# Patient Record
Sex: Female | Born: 1965 | Hispanic: No | Marital: Married | Smoking: Never smoker
Health system: Southern US, Community
[De-identification: ages and names within clinical notes are randomized; demographics above are authoritative.]

## PROBLEM LIST (undated history)

## (undated) HISTORY — PX: OTHER SURGICAL HISTORY: SHX169

---

## 1997-07-01 ENCOUNTER — Inpatient Hospital Stay (HOSPITAL_COMMUNITY): Admission: AD | Admit: 1997-07-01 | Discharge: 1997-07-04 | Payer: Self-pay | Admitting: Obstetrics and Gynecology

## 1998-04-12 ENCOUNTER — Other Ambulatory Visit: Admission: RE | Admit: 1998-04-12 | Discharge: 1998-04-12 | Payer: Self-pay | Admitting: Obstetrics & Gynecology

## 1998-04-20 ENCOUNTER — Ambulatory Visit (HOSPITAL_COMMUNITY): Admission: RE | Admit: 1998-04-20 | Discharge: 1998-04-20 | Payer: Self-pay | Admitting: Obstetrics & Gynecology

## 1998-08-13 ENCOUNTER — Inpatient Hospital Stay (HOSPITAL_COMMUNITY): Admission: AD | Admit: 1998-08-13 | Discharge: 1998-08-16 | Payer: Self-pay | Admitting: Obstetrics & Gynecology

## 2001-03-12 ENCOUNTER — Other Ambulatory Visit: Admission: RE | Admit: 2001-03-12 | Discharge: 2001-03-12 | Payer: Self-pay | Admitting: Obstetrics and Gynecology

## 2002-04-14 ENCOUNTER — Other Ambulatory Visit: Admission: RE | Admit: 2002-04-14 | Discharge: 2002-04-14 | Payer: Self-pay | Admitting: Obstetrics and Gynecology

## 2003-10-08 ENCOUNTER — Other Ambulatory Visit: Admission: RE | Admit: 2003-10-08 | Discharge: 2003-10-08 | Payer: Self-pay | Admitting: Obstetrics and Gynecology

## 2004-02-03 ENCOUNTER — Ambulatory Visit (HOSPITAL_COMMUNITY): Admission: RE | Admit: 2004-02-03 | Discharge: 2004-02-03 | Payer: Self-pay | Admitting: Family Medicine

## 2004-02-19 ENCOUNTER — Ambulatory Visit (HOSPITAL_COMMUNITY): Admission: RE | Admit: 2004-02-19 | Discharge: 2004-02-19 | Payer: Self-pay | Admitting: *Deleted

## 2005-11-09 ENCOUNTER — Ambulatory Visit (HOSPITAL_COMMUNITY): Admission: RE | Admit: 2005-11-09 | Discharge: 2005-11-09 | Payer: Self-pay | Admitting: Family Medicine

## 2006-02-25 ENCOUNTER — Emergency Department (HOSPITAL_COMMUNITY): Admission: EM | Admit: 2006-02-25 | Discharge: 2006-02-25 | Payer: Self-pay | Admitting: *Deleted

## 2006-03-27 ENCOUNTER — Ambulatory Visit (HOSPITAL_COMMUNITY): Admission: RE | Admit: 2006-03-27 | Discharge: 2006-03-27 | Payer: Self-pay | Admitting: Gastroenterology

## 2007-12-18 ENCOUNTER — Ambulatory Visit (HOSPITAL_COMMUNITY): Admission: RE | Admit: 2007-12-18 | Discharge: 2007-12-18 | Payer: Self-pay | Admitting: Family Medicine

## 2008-01-24 HISTORY — PX: BREAST BIOPSY: SHX20

## 2009-03-03 IMAGING — US US ABDOMEN COMPLETE
1 series · 14 of 25 positions shown · non-contrast
Comparison: None.

CLINICAL DATA: 41 year-old abdominal pain.
 ABDOMEN ULTRASOUND:
TECHNIQUE: Complete abdominal ultrasound examination was performed including evaluation of the liver, gallbladder, bile ducts, pancreas, kidneys, spleen, IVC, and abdominal aorta.

[Series 1: unknown · 0.28mm/px · 14 of 55 slices shown]
[im 1/55]
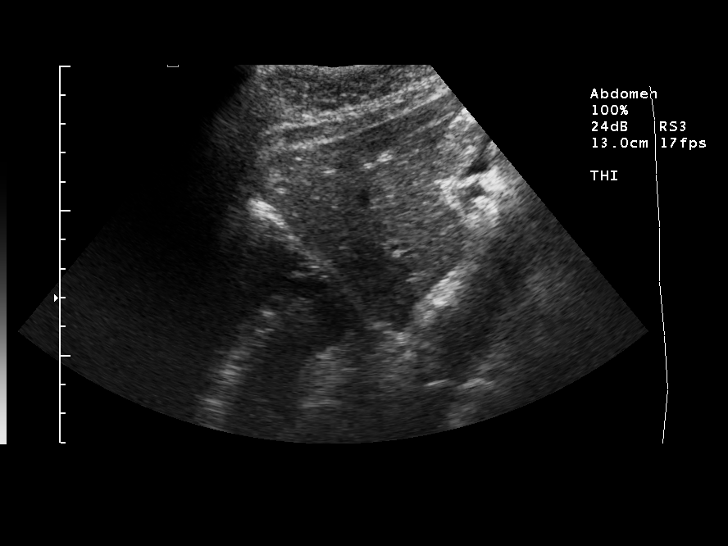
[im 5/55]
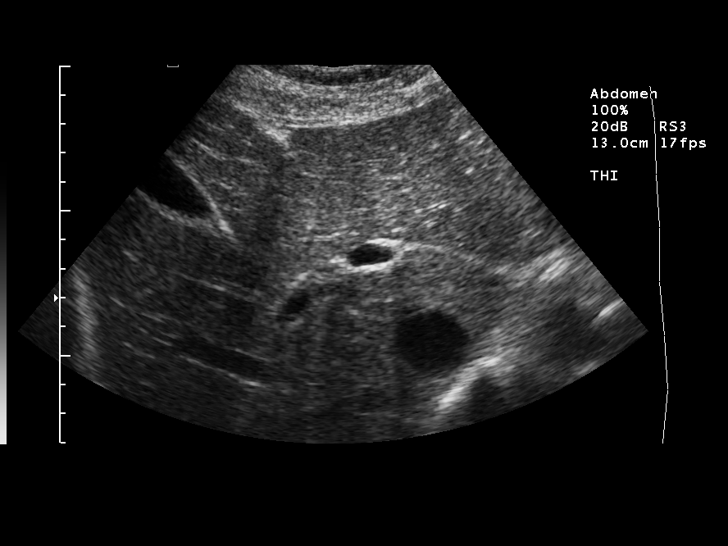
[im 10/55]
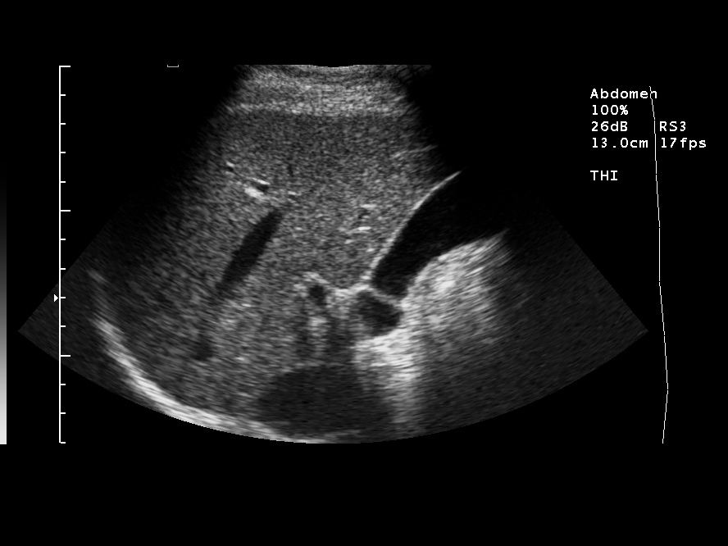
[im 14/55]
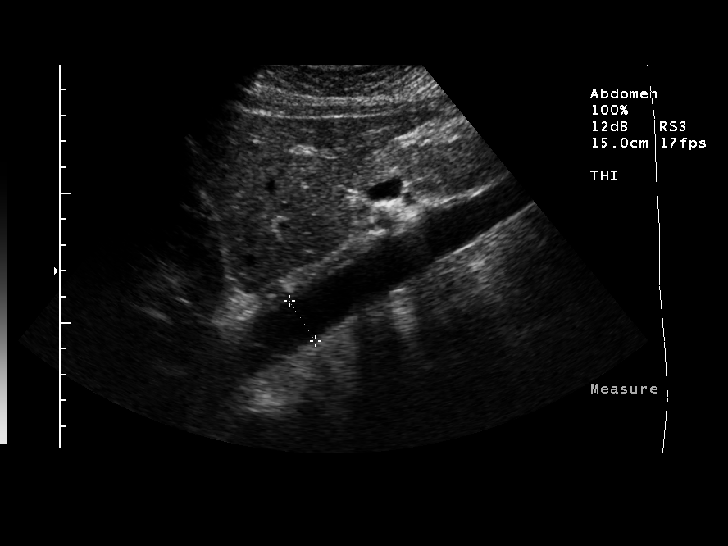
[im 19/55]
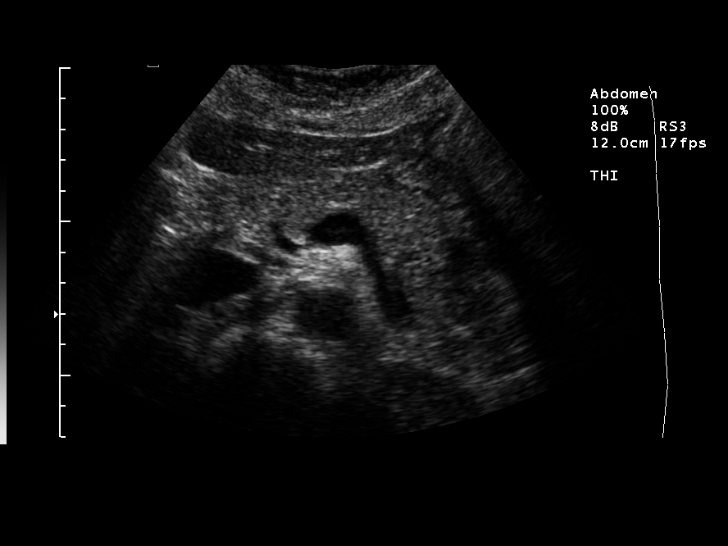
[im 21/55]
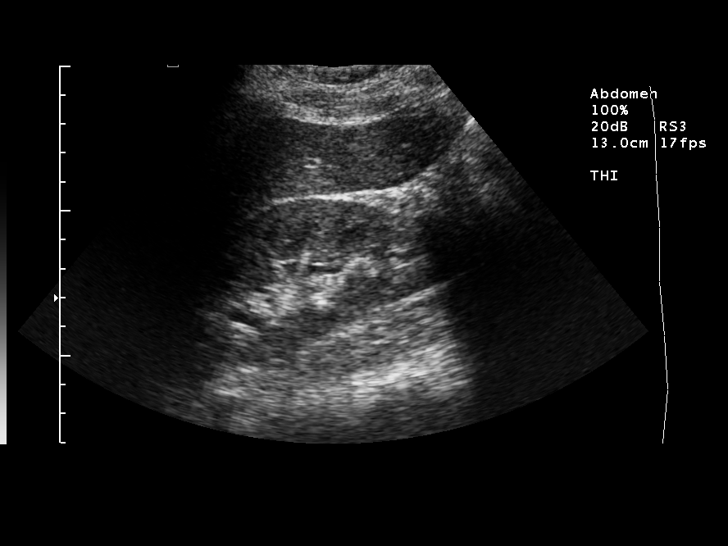
[im 25/55]
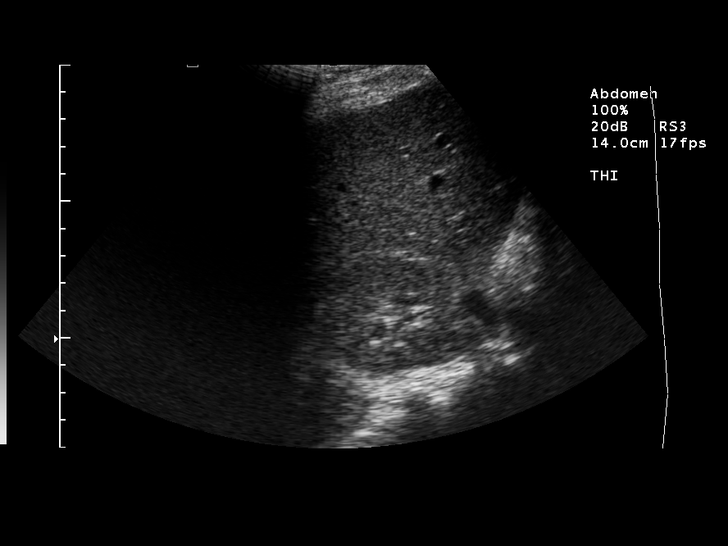
[im 30/55]
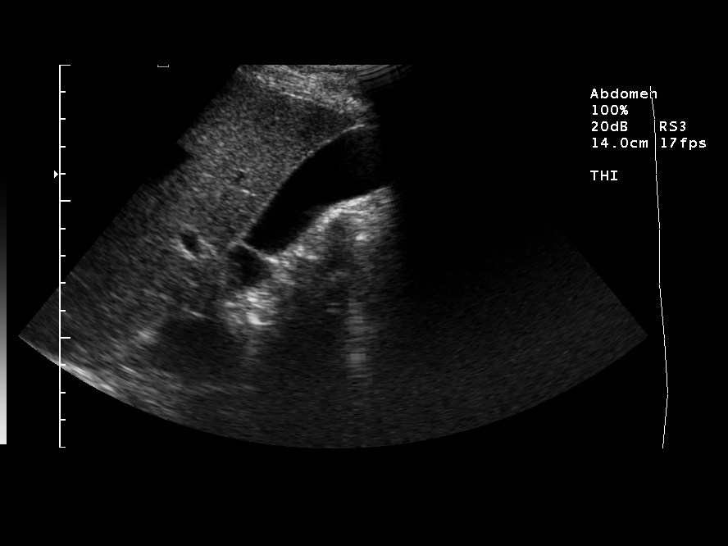
[im 34/55]
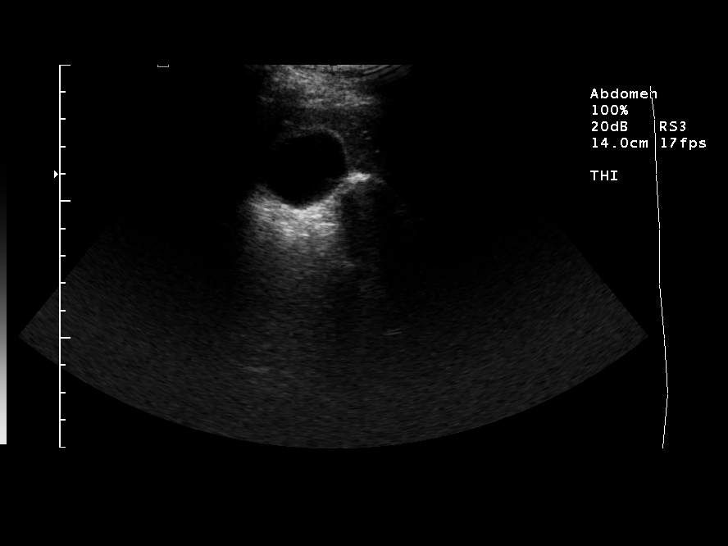
[im 37/55]
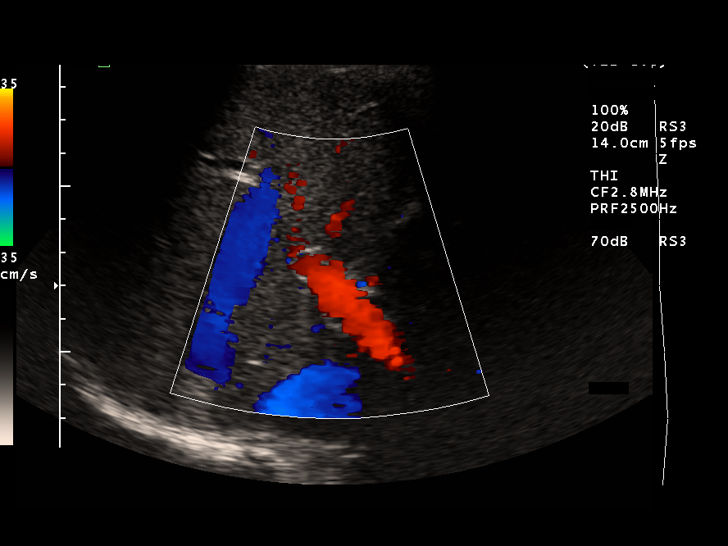
[im 41/55]
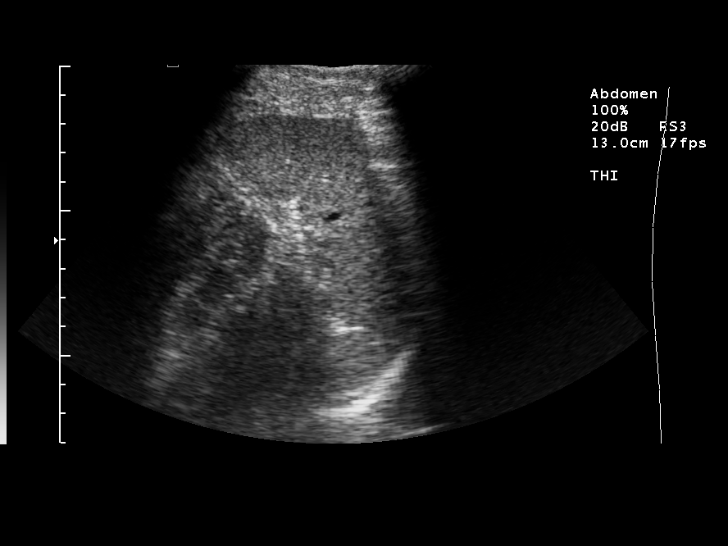
[im 46/55]
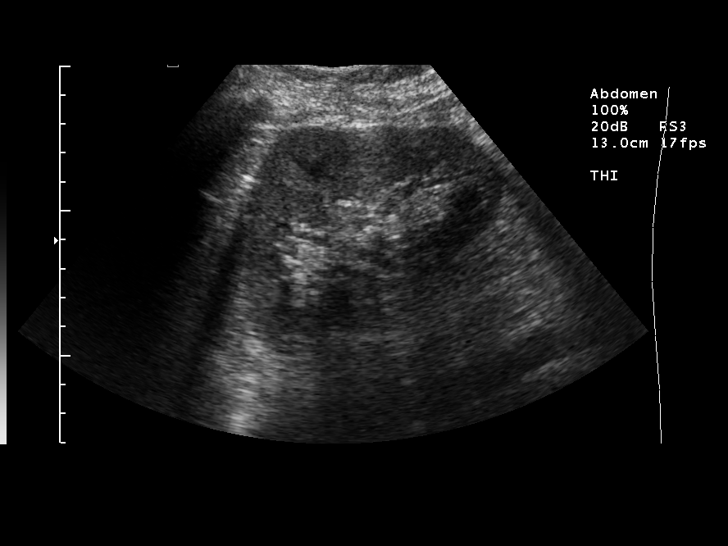
[im 50/55]
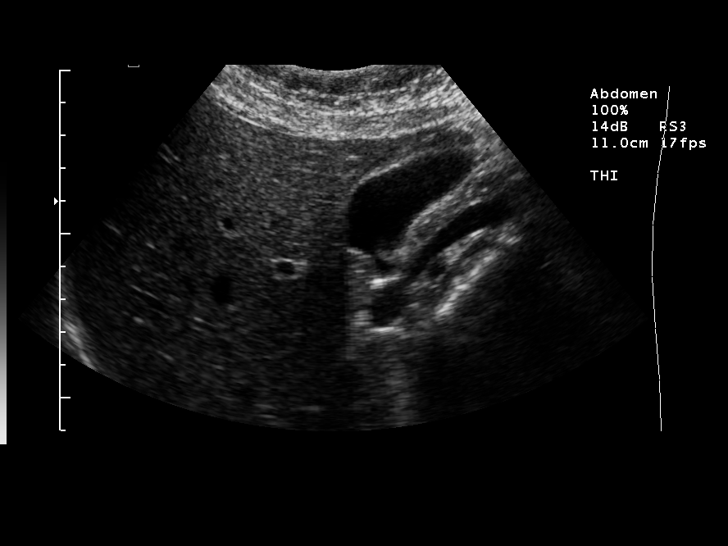
[im 55/55]
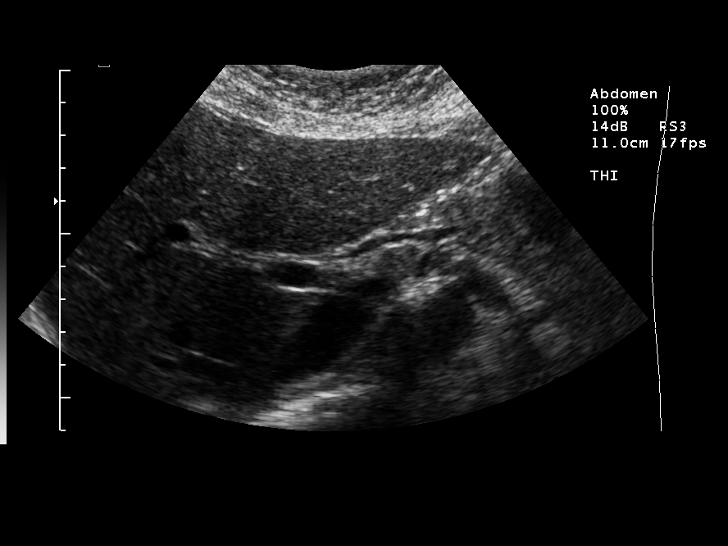

[14 of 25 positions shown; findings below may reference images not displayed]

FINDINGS: The liver is sonographically normal. No focal hepatic lesions or intrahepatic biliary dilatation. Common bile duct is normal in caliber measuring 4.0 mm. Gallbladder is sonographically normal. 
 The pancreas is well visualized and demonstrates no sonographic abnormalities.  The IVC and aorta are normal in caliber.
 The spleen is normal in size and demonstrates normal echogenicity.  No focal lesions.
 The right kidney measures 11.4 cm. The left kidney measures 11.1 cm.  Normal renal cortical thickness. No hydronephrosis or renal masses.  No perinephric fluid collections.
IMPRESSION: Unremarkable abdominal ultrasound examination.

## 2010-02-12 ENCOUNTER — Encounter: Payer: Self-pay | Admitting: Family Medicine

## 2010-07-25 ENCOUNTER — Other Ambulatory Visit: Payer: Self-pay | Admitting: Family Medicine

## 2010-07-25 DIAGNOSIS — Z139 Encounter for screening, unspecified: Secondary | ICD-10-CM

## 2010-07-29 ENCOUNTER — Ambulatory Visit (HOSPITAL_COMMUNITY)
Admission: RE | Admit: 2010-07-29 | Discharge: 2010-07-29 | Disposition: A | Discharge status: Home / Self Care | Payer: BC Managed Care – PPO | Source: Ambulatory Visit | Attending: Family Medicine | Admitting: Family Medicine

## 2010-07-29 DIAGNOSIS — Z1231 Encounter for screening mammogram for malignant neoplasm of breast: Secondary | ICD-10-CM | POA: Insufficient documentation

## 2010-07-29 DIAGNOSIS — Z139 Encounter for screening, unspecified: Secondary | ICD-10-CM

## 2011-12-12 ENCOUNTER — Other Ambulatory Visit (HOSPITAL_COMMUNITY): Payer: Self-pay | Admitting: Nurse Practitioner

## 2011-12-12 DIAGNOSIS — Z139 Encounter for screening, unspecified: Secondary | ICD-10-CM

## 2011-12-25 ENCOUNTER — Ambulatory Visit (HOSPITAL_COMMUNITY)
Admission: RE | Admit: 2011-12-25 | Discharge: 2011-12-25 | Disposition: A | Discharge status: Home / Self Care | Payer: Self-pay | Source: Ambulatory Visit | Attending: Nurse Practitioner | Admitting: Nurse Practitioner

## 2011-12-25 DIAGNOSIS — Z139 Encounter for screening, unspecified: Secondary | ICD-10-CM

## 2012-12-13 ENCOUNTER — Encounter (INDEPENDENT_AMBULATORY_CARE_PROVIDER_SITE_OTHER): Payer: Self-pay

## 2012-12-13 ENCOUNTER — Ambulatory Visit (INDEPENDENT_AMBULATORY_CARE_PROVIDER_SITE_OTHER): Payer: BC Managed Care – PPO | Admitting: General Practice

## 2012-12-13 ENCOUNTER — Encounter: Payer: Self-pay | Admitting: General Practice

## 2012-12-13 VITALS — BP 125/80 | HR 87 | Temp 98.3°F | Ht 69.25 in | Wt 186.0 lb

## 2012-12-13 DIAGNOSIS — Z01419 Encounter for gynecological examination (general) (routine) without abnormal findings: Secondary | ICD-10-CM

## 2012-12-13 DIAGNOSIS — Z124 Encounter for screening for malignant neoplasm of cervix: Secondary | ICD-10-CM

## 2012-12-13 DIAGNOSIS — Z0000 Encounter for general adult medical examination without abnormal findings: Secondary | ICD-10-CM

## 2012-12-13 LAB — POCT CBC
Granulocyte percent: 67.6 %G (ref 37–80)
HCT, POC: 40.1 % (ref 37.7–47.9)
Hemoglobin: 13.4 g/dL (ref 12.2–16.2)
Lymph, poc: 1.8 (ref 0.6–3.4)
MCHC: 33.5 g/dL (ref 31.8–35.4)
MPV: 6.8 fL (ref 0–99.8)
POC Granulocyte: 4.5 (ref 2–6.9)
RBC: 4.5 M/uL (ref 4.04–5.48)

## 2012-12-13 NOTE — Progress Notes (Signed)
  Subjective:    Patient ID: Mary Park, female    DOB: [birth year], 47 y.o.   MRN: [id number]  HPI Patient presents today for annual exam. She reports healthy eating and regular exercise up until 2 years ago. She reports having a stressful job and changed positions 2 years ago. Reports last menstrual cycle was Oct. 28, 2014 and regular.     Review of Systems  Constitutional: Negative for fever and chills.  Respiratory: Negative for chest tightness and shortness of breath.   Cardiovascular: Negative for chest pain and palpitations.  Gastrointestinal: Negative for nausea, vomiting, abdominal pain, diarrhea, constipation and blood in stool.  Genitourinary: Negative for dysuria and difficulty urinating.  Musculoskeletal: Negative for back pain.  Neurological: Negative for dizziness, weakness and headaches.       Objective:   Physical Exam  Constitutional: She is oriented to person, place, and time. She appears well-developed and well-nourished.  HENT:  Head: Normocephalic and atraumatic.  Right Ear: External ear normal.  Left Ear: External ear normal.  Mouth/Throat: Oropharynx is clear and moist.  Eyes: Conjunctivae and EOM are normal. Pupils are equal, round, and reactive to light.  Neck: Normal range of motion. Neck supple. No thyromegaly present.  Cardiovascular: Normal rate, regular rhythm, normal heart sounds and intact distal pulses.   Pulmonary/Chest: Effort normal and breath sounds normal. No respiratory distress. She exhibits no tenderness. Right breast exhibits no inverted nipple, no mass, no nipple discharge, no skin change and no tenderness. Left breast exhibits no inverted nipple, no mass, no nipple discharge, no skin change and no tenderness. Breasts are symmetrical.  Abdominal: Soft. Bowel sounds are normal. She exhibits no distension. There is no tenderness.  Genitourinary: Vagina normal and uterus normal. No breast swelling, tenderness, discharge or bleeding. No labial  fusion. There is no rash, tenderness, lesion or injury on the right labia. There is no rash, tenderness, lesion or injury on the left labia. Uterus is not deviated, not enlarged, not fixed and not tender. Cervix exhibits no motion tenderness, no discharge and no friability. Right adnexum displays no mass, no tenderness and no fullness. Left adnexum displays no mass, no tenderness and no fullness. No erythema, tenderness or bleeding around the vagina. No foreign body around the vagina. No signs of injury around the vagina. No vaginal discharge found.  Musculoskeletal: Normal range of motion.  Lymphadenopathy:    She has no cervical adenopathy.  Neurological: She is alert and oriented to person, place, and time.  Skin: Skin is warm and dry.  Psychiatric: She has a normal mood and affect.          Assessment & Plan:  1. Annual physical exam - POCT CBC - CMP14+EGFR - NMR, lipoprofile - Pap IG w/ reflex to HPV when ASC-U -discussed healthy eating and regular exercise -Patient verbalized understanding -Coralie Keens, FNP-C

## 2012-12-13 NOTE — Patient Instructions (Signed)

## 2012-12-15 LAB — CMP14+EGFR
ALT: 9 IU/L (ref 0–32)
Albumin/Globulin Ratio: 1.6 (ref 1.1–2.5)
Albumin: 4.6 g/dL (ref 3.5–5.5)
BUN: 9 mg/dL (ref 6–24)
Calcium: 10.2 mg/dL (ref 8.7–10.2)
Creatinine, Ser: 0.57 mg/dL (ref 0.57–1.00)
GFR calc Af Amer: 129 mL/min/{1.73_m2} (ref >59)
GFR calc non Af Amer: 112 mL/min/{1.73_m2} (ref >59)
Globulin, Total: 2.9 g/dL (ref 1.5–4.5)
Glucose: 88 mg/dL (ref 65–99)
Potassium: 4.3 mmol/L (ref 3.5–5.2)
Total Bilirubin: 0.5 mg/dL (ref 0.0–1.2)
Total Protein: 7.5 g/dL (ref 6.0–8.5)

## 2012-12-15 LAB — NMR, LIPOPROFILE
Cholesterol: 169 mg/dL (ref <200)
HDL Cholesterol by NMR: 69 mg/dL (ref >=40)
LDL Size: 21.2 nm (ref >20.5)
Small LDL Particle Number: 122 nmol/L (ref <=527)
Triglycerides by NMR: 84 mg/dL (ref <150)

## 2012-12-17 ENCOUNTER — Other Ambulatory Visit: Payer: Self-pay | Admitting: General Practice

## 2012-12-17 DIAGNOSIS — E785 Hyperlipidemia, unspecified: Secondary | ICD-10-CM

## 2012-12-17 MED ORDER — ATORVASTATIN CALCIUM 10 MG PO TABS: 10.0000 mg | ORAL_TABLET | Freq: Every day | ORAL | Status: DC

## 2012-12-19 LAB — PAP IG W/ RFLX HPV ASCU: PAP Smear Comment: 0

## 2012-12-26 ENCOUNTER — Other Ambulatory Visit: Payer: Self-pay | Admitting: General Practice

## 2012-12-26 ENCOUNTER — Telehealth: Payer: Self-pay | Admitting: General Practice

## 2012-12-26 DIAGNOSIS — Z139 Encounter for screening, unspecified: Secondary | ICD-10-CM

## 2012-12-26 NOTE — Telephone Encounter (Signed)
Patient aware of results.

## 2012-12-27 ENCOUNTER — Ambulatory Visit (HOSPITAL_COMMUNITY)
Admission: RE | Admit: 2012-12-27 | Discharge: 2012-12-27 | Disposition: A | Discharge status: Home / Self Care | Payer: BC Managed Care – PPO | Source: Ambulatory Visit | Attending: General Practice | Admitting: General Practice

## 2012-12-27 DIAGNOSIS — Z1231 Encounter for screening mammogram for malignant neoplasm of breast: Secondary | ICD-10-CM | POA: Insufficient documentation

## 2012-12-27 DIAGNOSIS — Z139 Encounter for screening, unspecified: Secondary | ICD-10-CM

## 2014-01-13 ENCOUNTER — Other Ambulatory Visit: Payer: Self-pay | Admitting: Family Medicine

## 2014-01-13 DIAGNOSIS — Z139 Encounter for screening, unspecified: Secondary | ICD-10-CM

## 2014-01-21 ENCOUNTER — Ambulatory Visit (HOSPITAL_COMMUNITY)
Admission: RE | Admit: 2014-01-21 | Discharge: 2014-01-21 | Disposition: A | Discharge status: Home / Self Care | Payer: BC Managed Care – PPO | Source: Ambulatory Visit | Attending: Family Medicine | Admitting: Family Medicine

## 2014-01-21 DIAGNOSIS — Z1231 Encounter for screening mammogram for malignant neoplasm of breast: Secondary | ICD-10-CM | POA: Insufficient documentation

## 2014-01-21 DIAGNOSIS — Z139 Encounter for screening, unspecified: Secondary | ICD-10-CM

## 2014-01-21 DIAGNOSIS — R928 Other abnormal and inconclusive findings on diagnostic imaging of breast: Secondary | ICD-10-CM | POA: Insufficient documentation

## 2014-01-22 ENCOUNTER — Other Ambulatory Visit: Payer: Self-pay | Admitting: Family Medicine

## 2014-01-22 DIAGNOSIS — IMO0002 Reserved for concepts with insufficient information to code with codable children: Secondary | ICD-10-CM

## 2014-01-22 DIAGNOSIS — R229 Localized swelling, mass and lump, unspecified: Secondary | ICD-10-CM

## 2014-01-22 DIAGNOSIS — N6320 Unspecified lump in the left breast, unspecified quadrant: Secondary | ICD-10-CM

## 2014-02-03 ENCOUNTER — Other Ambulatory Visit: Payer: Self-pay | Admitting: Family Medicine

## 2014-02-03 ENCOUNTER — Ambulatory Visit (HOSPITAL_COMMUNITY)
Admission: RE | Admit: 2014-02-03 | Discharge: 2014-02-03 | Disposition: A | Discharge status: Home / Self Care | Payer: 59 | Source: Ambulatory Visit | Attending: Family Medicine | Admitting: Family Medicine

## 2014-02-03 DIAGNOSIS — R229 Localized swelling, mass and lump, unspecified: Principal | ICD-10-CM

## 2014-02-03 DIAGNOSIS — IMO0002 Reserved for concepts with insufficient information to code with codable children: Secondary | ICD-10-CM

## 2014-02-03 DIAGNOSIS — N63 Unspecified lump in breast: Secondary | ICD-10-CM | POA: Insufficient documentation

## 2014-02-03 DIAGNOSIS — N6320 Unspecified lump in the left breast, unspecified quadrant: Secondary | ICD-10-CM

## 2014-02-03 MED ORDER — LIDOCAINE HCL (PF) 2 % IJ SOLN
INTRAMUSCULAR | Status: AC
  Filled 2014-02-03: qty 10

## 2014-02-03 NOTE — Discharge Instructions (Signed)
Fine Needle Aspiration °Fine needle aspiration is a procedure used to remove a piece of tissue. The tissue may be removed from a swelling or abnormal growth (tumor). It is also used to confirm a cyst. A cyst is a fluid filled sac. The procedure may be done by your caregiver or a specialist. °LET YOUR CAREGIVER KNOW ABOUT:  °· Any medications you take, especially blood thinners like aspirin. °· Any problems you may have had with similar procedures in the past. °RISKS AND COMPLICATIONS °This is a safe procedure. There is a very small risk of infection and or bleeding. Other complications can occur if the aspiration site is deep in the body. Your caregiver or specialist will explain this to you.  °BEFORE THE PROCEDURE  °· No special preparation is needed in most cases. Your caregiver will let you know if there are special requirements, such as an empty stomach. °· Be sure to ask your caregiver any questions before the procedure starts. °PROCEDURE  °· This procedure is done under local or no anesthetic. °· The skin is cleaned carefully. °· A thin needle is directed into a lump. The needle is directed in several different directions into the lump while suction is applied to the needle. When samples are removed, the needle is withdrawn. °· The contents obtained are placed on a slide. They are then fixed, stained and examined under the microscope. The slide is examined by a specialist in the examination of tissue (pathologist). °· A diagnosis can then be made. The pathologist will decide if the specimen is cancerous (malignant) or not cancerous (benign). If fluid is taken from a cyst, cells from the fluid can be examined. If no material is obtained from a fine needle aspiration, the sample may not rule out a problem. Sometimes the procedure is done again. The pathologist may need several days before a result is available. °AFTER THE PROCEDURE  °· There are usually no limits on diet or activity after a fine needle  aspiration. °· Your caregiver may give you instructions regarding the aspiration site. This will include information about keeping the site clean and dry. Follow these instructions carefully. °· Call for your test results as instructed by your caregiver. Remember, it is your responsibility to get the results of your testing. Do not think everything is fine if you have not heard from your caregiver. °· Keep a close watch on the aspiration site. Report any redness, swelling or drainage. °· You should not have much pain at the aspiration site. °· Take any medications as told by your caregiver. °SEEK MEDICAL CARE IF:  °· You have pain or drainage at the aspiration site that does not go away. °· Swelling at the aspiration site does not gradually go away. °SEEK IMMEDIATE MEDICAL CARE IF:  °· You develop a fever a day or two after the aspiration. °· You develop severe pain at the aspiration site. °· You develop a warm, tender swelling at the aspiration site. °Document Released: 01/07/2000 Document Revised: 04/03/2011 Document Reviewed: 09/20/2007 °ExitCare® Patient Information ©2015 [employer]. This information is not intended to replace advice given to you by your health care provider. Make sure you discuss any questions you have with your health care provider. ° °

## 2014-06-02 ENCOUNTER — Ambulatory Visit (INDEPENDENT_AMBULATORY_CARE_PROVIDER_SITE_OTHER): Payer: 59 | Admitting: Family Medicine

## 2014-06-02 ENCOUNTER — Encounter: Payer: Self-pay | Admitting: Family Medicine

## 2014-06-02 VITALS — BP 135/75 | HR 81 | Temp 98.1°F | Ht 69.25 in | Wt 194.4 lb

## 2014-06-02 DIAGNOSIS — N644 Mastodynia: Secondary | ICD-10-CM

## 2014-06-02 NOTE — Patient Instructions (Signed)
Fibrocystic Breast Changes Fibrocystic breast changes occur when breast ducts become blocked, causing painful, fluid-filled lumps (cysts) to form in the breast. This is a common condition that is noncancerous (benign). It occurs when women go through hormonal changes during their menstrual cycle. Fibrocystic breast changes can affect one or both breasts. CAUSES  The exact cause of fibrocystic breast changes is not known, but it may be related to the female hormones estrogen and progesterone. Family traits that get passed from parent to child (genetics) may also be a factor in some cases. SIGNS AND SYMPTOMS   Tenderness, mild discomfort, or pain.   Swelling.   Ropelike feeling when touching the breast.   Lumpy breast, one or both sides.   Changes in breast size, especially before (larger) and after (smaller) the menstrual period.   Green or dark brown nipple discharge (not blood).  Symptoms are usually worse before menstrual periods start and get better toward the end of the menstrual period.  DIAGNOSIS  To make a diagnosis, your health care provider will ask you questions and perform a physical exam of your breasts. The health care provider may recommend other tests that can examine inside your breasts, such as:  A breast X-ray (mammogram).   Ultrasonography.  An MRI.  If something more than fibrocystic breast changes is suspected, your health care provider may take a breast tissue sample (breast biopsy) to examine. TREATMENT  Often, treatment is not needed. Your health care provider may recommend over-the-counter pain relievers to help lessen pain or discomfort caused by the fibrocystic breast changes. You may also be asked to change your diet to limit or stop eating foods or drinking beverages that contain caffeine. Foods and beverages that contain caffeine include chocolate, soda, coffee, and tea. Reducing sugar and fat in your diet may also help. Your health care provider may  also recommend:  Fine needle aspiration to remove fluid from a cyst that is causing pain.   Surgery to remove a large, persistent, and tender cyst. HOME CARE INSTRUCTIONS   Examine your breasts after every menstrual period. If you do not have menstrual periods, check your breasts the first day of every month. Feel for changes, such as more tenderness, a new growth, a change in breast size, or a change in a lump that has always been there.   Only take over-the-counter or prescription medicine as directed by your health care provider.   Wear a well-fitted support or sports bra, especially when exercising.   Decrease or avoid caffeine, fat, and sugar in your diet as directed by your health care provider.  SEEK MEDICAL CARE IF:   You have fluid leaking (discharge) from your nipples, especially bloody discharge.   You have new lumps or bumps in the breast.   Your breast or breasts become enlarged, red, and painful.   You have areas of your breast that pucker in.   Your nipples appear flat or indented.  Document Released: 10/26/2005 Document Revised: 01/14/2013 Document Reviewed: 06/30/2012 ExitCare Patient Information 2015 [employer]. This information is not intended to replace advice given to you by your health care provider. Make sure you discuss any questions you have with your health care provider.  

## 2014-06-02 NOTE — Progress Notes (Signed)
   Subjective:    Patient ID: Mary Park, female    DOB: [birth year], 49 y.o.   MRN: [id number]  HPI 49 year old female with breast pain. She has a history of fibrocystic disease dating back 10 years. She had a cyst removed about 10 years ago. Prior to Christmas of 2015 she began having more pain on the left side. She had a mammogram with subsequent ultrasound and biopsy. She was told that there was only cystic changes in follow-up in one year. Since then she's continued to have breast pain and it seems like it moves around. There is a positive family history of her older sister  There are no active problems to display for this patient.  Outpatient Encounter Prescriptions as of 06/02/2014  Medication Sig  . atorvastatin (LIPITOR) 10 MG tablet Take 1 tablet (10 mg total) by mouth daily. (Patient not taking: Reported on 06/02/2014)   No facility-administered encounter medications on file as of 06/02/2014.      Review of Systems  Constitutional: Negative.   HENT: Negative.   Eyes: Negative.   Respiratory: Negative.   Cardiovascular: Negative.   Gastrointestinal: Negative.   Endocrine: Negative.   Genitourinary: Negative.   Hematological: Negative.   Psychiatric/Behavioral: Negative.        Objective:   Physical Exam  Pulmonary/Chest:  Exam confined to left breast. There are no palpable cysts or masses in the breast and it does not seem to be tender on exam area there is no discharge from the nipple.          Assessment & Plan:  1. Breast pain, left Probable fibrocystic disease schedule - US BREAST COMPLETE UNI LEFT INC AXILLA; Future  Frederica KusterStephen M Miller MD

## 2014-06-08 ENCOUNTER — Other Ambulatory Visit: Payer: Self-pay

## 2014-06-08 DIAGNOSIS — N644 Mastodynia: Secondary | ICD-10-CM

## 2014-06-12 ENCOUNTER — Telehealth: Payer: Self-pay

## 2014-06-12 NOTE — Telephone Encounter (Signed)
[facility] said they do not work from a workqueue and they see the order for the Dx mammogram to have the patient call and set up   I called and LM for patient to call them to schedule

## 2014-06-18 ENCOUNTER — Ambulatory Visit
Admission: RE | Admit: 2014-06-18 | Discharge: 2014-06-18 | Disposition: A | Discharge status: Home / Self Care | Payer: 59 | Source: Ambulatory Visit | Attending: Family Medicine | Admitting: Family Medicine

## 2014-06-18 ENCOUNTER — Other Ambulatory Visit: Payer: Self-pay | Admitting: Family Medicine

## 2014-06-18 DIAGNOSIS — N644 Mastodynia: Secondary | ICD-10-CM

## 2014-07-24 ENCOUNTER — Encounter: Payer: Self-pay | Admitting: Nurse Practitioner

## 2014-08-10 ENCOUNTER — Ambulatory Visit (INDEPENDENT_AMBULATORY_CARE_PROVIDER_SITE_OTHER): Payer: 59

## 2014-08-10 ENCOUNTER — Encounter (INDEPENDENT_AMBULATORY_CARE_PROVIDER_SITE_OTHER): Payer: Self-pay

## 2014-08-10 ENCOUNTER — Encounter: Payer: Self-pay | Admitting: Nurse Practitioner

## 2014-08-10 ENCOUNTER — Ambulatory Visit (INDEPENDENT_AMBULATORY_CARE_PROVIDER_SITE_OTHER): Payer: 59 | Admitting: Nurse Practitioner

## 2014-08-10 VITALS — BP 132/79 | HR 83 | Temp 97.0°F | Ht 69.0 in | Wt 190.0 lb

## 2014-08-10 DIAGNOSIS — Z0000 Encounter for general adult medical examination without abnormal findings: Secondary | ICD-10-CM | POA: Diagnosis not present

## 2014-08-10 DIAGNOSIS — Z01419 Encounter for gynecological examination (general) (routine) without abnormal findings: Secondary | ICD-10-CM

## 2014-08-10 DIAGNOSIS — N3000 Acute cystitis without hematuria: Secondary | ICD-10-CM

## 2014-08-10 LAB — POCT URINALYSIS DIPSTICK
Bilirubin, UA: NEGATIVE
Glucose, UA: NEGATIVE
Ketones, UA: NEGATIVE
NITRITE UA: NEGATIVE
Spec Grav, UA: 1.01
Urobilinogen, UA: NEGATIVE
pH, UA: 6.5

## 2014-08-10 LAB — POCT CBC
Granulocyte percent: 64.2 %G (ref 37–80)
HCT, POC: 38.4 % (ref 37.7–47.9)
HEMOGLOBIN: 12.7 g/dL (ref 12.2–16.2)
LYMPH, POC: 2 (ref 0.6–3.4)
MCH: 28.1 pg (ref 27–31.2)
MCHC: 33.2 g/dL (ref 31.8–35.4)
MCV: 84.7 fL (ref 80–97)
MPV: 6.8 fL (ref 0–99.8)
POC Granulocyte: 4.4 (ref 2–6.9)
POC LYMPH %: 29.6 % (ref 10–50)
Platelet Count, POC: 306 10*3/uL (ref 142–424)
RBC: 4.53 M/uL (ref 4.04–5.48)
RDW, POC: 14.2 %
WBC: 6.8 10*3/uL (ref 4.6–10.2)

## 2014-08-10 LAB — POCT UA - MICROSCOPIC ONLY
CRYSTALS, UR, HPF, POC: NEGATIVE
Casts, Ur, LPF, POC: NEGATIVE
Yeast, UA: NEGATIVE

## 2014-08-10 MED ORDER — NITROFURANTOIN MONOHYD MACRO 100 MG PO CAPS: 100.0000 mg | ORAL_CAPSULE | Freq: Two times a day (BID) | ORAL | Status: DC

## 2014-08-10 NOTE — Progress Notes (Signed)
Subjective:    Patient ID: Mary Park, female    DOB: [birth year], 49 y.o.   MRN: [id number]  HPI Patient here today for CPE with pap- she has no current medical problems and is on no meds. No complaint today  Review of Systems  Constitutional: Negative.   HENT: Negative.   Respiratory: Negative.   Cardiovascular: Negative.   Genitourinary: Negative.   Neurological: Negative.   Psychiatric/Behavioral: Negative.   All other systems reviewed and are negative.      Objective:   Physical Exam  Constitutional: She is oriented to person, place, and time. She appears well-developed and well-nourished.  HENT:  Head: Normocephalic.  Right Ear: Hearing, tympanic membrane, external ear and ear canal normal.  Left Ear: Hearing, tympanic membrane, external ear and ear canal normal.  Nose: Nose normal.  Mouth/Throat: Uvula is midline and oropharynx is clear and moist.  Eyes: Conjunctivae and EOM are normal. Pupils are equal, round, and reactive to light.  Neck: Trachea normal, normal range of motion and full passive range of motion without pain. Neck supple. No JVD present. Carotid bruit is not present. No thyroid mass and no thyromegaly present.  Cardiovascular: Normal rate, regular rhythm, normal heart sounds and intact distal pulses.  Exam reveals no gallop and no friction rub.   No murmur heard. Pulmonary/Chest: Effort normal and breath sounds normal. Right breast exhibits no inverted nipple, no mass, no nipple discharge, no skin change and no tenderness. Left breast exhibits no inverted nipple, no mass, no nipple discharge, no skin change and no tenderness.  Abdominal: Soft. Bowel sounds are normal. She exhibits no distension and no mass. There is no tenderness.  Genitourinary: Vagina normal and uterus normal. No breast swelling, tenderness, discharge or bleeding.  bimanual exam-No adnexal masses or tenderness. Cervix parous and pink- no discharge  Musculoskeletal: Normal range of  motion.  Lymphadenopathy:    She has no cervical adenopathy.  Neurological: She is alert and oriented to person, place, and time. She has normal reflexes.  Skin: Skin is warm and dry.  Psychiatric: She has a normal mood and affect. Her behavior is normal. Judgment and thought content normal.   BP 132/79 mmHg  Pulse 83  Temp(Src) 97 F (36.1 C) (Oral)  Ht 5' 9" (1.753 m)  Wt 190 lb (86.183 kg)  BMI 28.05 kg/m2  EKG- NSR-Mary-Margaret Hassell Done, FNP  Chest x ray- normal-Preliminary reading by Ronnald Collum, FNP  [facility]       Assessment & Plan:  1. Annual physical exam - POCT UA - Microscopic Only - POCT urinalysis dipstick - POCT CBC - CMP14+EGFR - Lipid panel - Thyroid Panel With TSH - Vit D  25 hydroxy (rtn osteoporosis monitoring) - DG Chest 2 View; Future - EKG 12-Lead  2. Encounter for routine gynecological examination - Pap IG w/ reflex to HPV when ASC-U  3. Acute cystitis without hematuria Meds ordered this encounter  Medications  . nitrofurantoin, macrocrystal-monohydrate, (MACROBID) 100 MG capsule    Sig: Take 1 capsule (100 mg total) by mouth 2 (two) times daily. 1 po BId    Dispense:  14 capsule    Refill:  0    Order Specific Question:  Supervising Provider    Answer:  Chipper Herb [[id number]]   Take medication as prescribe Cotton underwear Take shower not bath Cranberry juice, yogurt Force fluids AZO over the counter X2 days Culture pending RTO prn   Labs pending Health maintenance reviewed Diet and exercise encouraged Continue  all meds Follow up  In 6 months   [place], FNP

## 2014-08-10 NOTE — Patient Instructions (Signed)

## 2014-08-11 LAB — PAP IG W/ RFLX HPV ASCU: PAP Smear Comment: 0

## 2014-08-11 LAB — CMP14+EGFR
ALBUMIN: 4.5 g/dL (ref 3.5–5.5)
ALK PHOS: 75 IU/L (ref 39–117)
ALT: 12 IU/L (ref 0–32)
AST: 13 IU/L (ref 0–40)
Albumin/Globulin Ratio: 1.3 (ref 1.1–2.5)
BUN/Creatinine Ratio: 15 (ref 9–23)
BUN: 9 mg/dL (ref 6–24)
Bilirubin Total: 0.5 mg/dL (ref 0.0–1.2)
CALCIUM: 10 mg/dL (ref 8.7–10.2)
CO2: 19 mmol/L (ref 18–29)
Chloride: 103 mmol/L (ref 97–108)
Creatinine, Ser: 0.62 mg/dL (ref 0.57–1.00)
GFR calc non Af Amer: 108 mL/min/{1.73_m2} (ref >59)
GFR, EST AFRICAN AMERICAN: 124 mL/min/{1.73_m2} (ref >59)
Globulin, Total: 3.5 g/dL (ref 1.5–4.5)
Glucose: 87 mg/dL (ref 65–99)
POTASSIUM: 4.1 mmol/L (ref 3.5–5.2)
Sodium: 137 mmol/L (ref 134–144)
Total Protein: 8 g/dL (ref 6.0–8.5)

## 2014-08-11 LAB — THYROID PANEL WITH TSH
Free Thyroxine Index: 2.4 (ref 1.2–4.9)
T3 UPTAKE RATIO: 26 % (ref 24–39)
T4, Total: 9.4 ug/dL (ref 4.5–12.0)
TSH: 1.37 u[IU]/mL (ref 0.450–4.500)

## 2014-08-11 LAB — LIPID PANEL
CHOL/HDL RATIO: 2.5 ratio (ref 0.0–4.4)
Cholesterol, Total: 167 mg/dL (ref 100–199)
HDL: 68 mg/dL (ref >39)
LDL Calculated: 80 mg/dL (ref 0–99)
Triglycerides: 93 mg/dL (ref 0–149)
VLDL CHOLESTEROL CAL: 19 mg/dL (ref 5–40)

## 2014-08-11 LAB — VITAMIN D 25 HYDROXY (VIT D DEFICIENCY, FRACTURES): Vit D, 25-Hydroxy: 20 ng/mL — ABNORMAL LOW (ref 30.0–100.0)

## 2014-12-25 ENCOUNTER — Telehealth: Payer: Self-pay | Admitting: Nurse Practitioner

## 2014-12-25 DIAGNOSIS — Z1239 Encounter for other screening for malignant neoplasm of breast: Secondary | ICD-10-CM

## 2014-12-25 NOTE — Telephone Encounter (Signed)
Pt would like to go [facility] this time. [facility]

## 2014-12-25 NOTE — Telephone Encounter (Signed)
Where does she usually get so I can do referral

## 2014-12-28 NOTE — Telephone Encounter (Signed)
Needs to really go to same place if possible- has she ever had abnormal mammo

## 2015-02-05 ENCOUNTER — Other Ambulatory Visit: Payer: Self-pay | Admitting: Family Medicine

## 2015-02-05 DIAGNOSIS — Z1231 Encounter for screening mammogram for malignant neoplasm of breast: Secondary | ICD-10-CM

## 2015-02-11 ENCOUNTER — Ambulatory Visit (HOSPITAL_COMMUNITY)
Admission: RE | Admit: 2015-02-11 | Discharge: 2015-02-11 | Disposition: A | Discharge status: Home / Self Care | Payer: BLUE CROSS/BLUE SHIELD | Source: Ambulatory Visit | Attending: Family Medicine | Admitting: Family Medicine

## 2015-02-11 DIAGNOSIS — Z1231 Encounter for screening mammogram for malignant neoplasm of breast: Secondary | ICD-10-CM

## 2015-02-11 DIAGNOSIS — R928 Other abnormal and inconclusive findings on diagnostic imaging of breast: Secondary | ICD-10-CM | POA: Insufficient documentation

## 2015-02-12 ENCOUNTER — Other Ambulatory Visit: Payer: Self-pay | Admitting: Family Medicine

## 2015-02-12 ENCOUNTER — Ambulatory Visit: Payer: 59 | Admitting: Nurse Practitioner

## 2015-02-12 DIAGNOSIS — R928 Other abnormal and inconclusive findings on diagnostic imaging of breast: Secondary | ICD-10-CM

## 2015-02-16 ENCOUNTER — Other Ambulatory Visit: Payer: Self-pay | Admitting: Nurse Practitioner

## 2015-02-16 DIAGNOSIS — IMO0002 Reserved for concepts with insufficient information to code with codable children: Secondary | ICD-10-CM

## 2015-02-16 DIAGNOSIS — R928 Other abnormal and inconclusive findings on diagnostic imaging of breast: Secondary | ICD-10-CM

## 2015-02-16 DIAGNOSIS — N6320 Unspecified lump in the left breast, unspecified quadrant: Secondary | ICD-10-CM

## 2015-02-16 DIAGNOSIS — R229 Localized swelling, mass and lump, unspecified: Secondary | ICD-10-CM

## 2015-02-23 ENCOUNTER — Ambulatory Visit (HOSPITAL_COMMUNITY)
Admission: RE | Admit: 2015-02-23 | Discharge: 2015-02-23 | Disposition: A | Discharge status: Home / Self Care | Payer: BLUE CROSS/BLUE SHIELD | Source: Ambulatory Visit | Attending: Nurse Practitioner | Admitting: Nurse Practitioner

## 2015-02-23 ENCOUNTER — Ambulatory Visit (HOSPITAL_COMMUNITY): Payer: BLUE CROSS/BLUE SHIELD

## 2015-02-23 ENCOUNTER — Encounter (HOSPITAL_COMMUNITY): Payer: BLUE CROSS/BLUE SHIELD

## 2015-02-23 DIAGNOSIS — R928 Other abnormal and inconclusive findings on diagnostic imaging of breast: Secondary | ICD-10-CM

## 2015-02-23 DIAGNOSIS — N63 Unspecified lump in breast: Secondary | ICD-10-CM | POA: Insufficient documentation

## 2015-02-23 DIAGNOSIS — N6320 Unspecified lump in the left breast, unspecified quadrant: Secondary | ICD-10-CM

## 2017-07-27 ENCOUNTER — Encounter: Payer: Self-pay | Admitting: Nurse Practitioner

## 2017-07-27 ENCOUNTER — Ambulatory Visit (INDEPENDENT_AMBULATORY_CARE_PROVIDER_SITE_OTHER): Payer: BLUE CROSS/BLUE SHIELD | Admitting: Nurse Practitioner

## 2017-07-27 VITALS — BP 120/75 | HR 79 | Temp 96.9°F | Ht 69.0 in | Wt 178.0 lb

## 2017-07-27 DIAGNOSIS — Z0000 Encounter for general adult medical examination without abnormal findings: Secondary | ICD-10-CM | POA: Diagnosis not present

## 2017-07-27 DIAGNOSIS — Z1231 Encounter for screening mammogram for malignant neoplasm of breast: Secondary | ICD-10-CM

## 2017-07-27 DIAGNOSIS — Z01419 Encounter for gynecological examination (general) (routine) without abnormal findings: Secondary | ICD-10-CM | POA: Diagnosis not present

## 2017-07-27 LAB — URINALYSIS, COMPLETE
Bilirubin, UA: NEGATIVE
Glucose, UA: NEGATIVE
KETONES UA: NEGATIVE
LEUKOCYTES UA: NEGATIVE
NITRITE UA: NEGATIVE
Protein, UA: NEGATIVE
SPEC GRAV UA: 1.015 (ref 1.005–1.030)
Urobilinogen, Ur: 0.2 mg/dL (ref 0.2–1.0)
pH, UA: 6 (ref 5.0–7.5)

## 2017-07-27 LAB — MICROSCOPIC EXAMINATION
Epithelial Cells (non renal): >10 /hpf — AB (ref 0–10)
Renal Epithel, UA: NONE SEEN /hpf

## 2017-07-27 NOTE — Progress Notes (Signed)
Subjective:    Patient ID: Mary Park, female    DOB: 11/22/1965, 52 y.o.   MRN: [id number]   Chief Complaint: Annual Exam   HPI Patient come sin today for annual physical exam and pap. She s doing well today without complaints. She has no current medical problems and is on no meds.    Review of Systems  Constitutional: Negative for activity change and appetite change.  HENT: Negative.   Eyes: Negative for pain.  Respiratory: Negative for shortness of breath.   Cardiovascular: Negative for chest pain, palpitations and leg swelling.  Gastrointestinal: Negative for abdominal pain.  Endocrine: Negative for polydipsia.  Genitourinary: Negative.   Skin: Negative for rash.  Neurological: Negative for dizziness, weakness and headaches.  Hematological: Does not bruise/bleed easily.  Psychiatric/Behavioral: Negative.   All other systems reviewed and are negative.      Objective:   Physical Exam  Constitutional: She is oriented to person, place, and time. She appears well-developed and well-nourished. No distress.  HENT:  Head: Normocephalic.  Nose: Nose normal.  Mouth/Throat: Oropharynx is clear and moist.  Eyes: Pupils are equal, round, and reactive to light. EOM are normal.  Neck: Normal range of motion. Neck supple. No JVD present. Carotid bruit is not present.  Cardiovascular: Normal rate, regular rhythm, normal heart sounds and intact distal pulses.  Pulmonary/Chest: Effort normal and breath sounds normal. No respiratory distress. She has no wheezes. She has no rales. She exhibits no tenderness.  Abdominal: Soft. Normal appearance, normal aorta and bowel sounds are normal. She exhibits no distension, no abdominal bruit, no pulsatile midline mass and no mass. There is no splenomegaly or hepatomegaly. There is no tenderness.  Genitourinary: Vagina normal and uterus normal. No vaginal discharge found.  Genitourinary Comments: Cervix parous and pink cystocele    Musculoskeletal: Normal range of motion. She exhibits no edema.  Lymphadenopathy:    She has no cervical adenopathy.  Neurological: She is alert and oriented to person, place, and time. She has normal reflexes.  Skin: Skin is warm and dry.  Psychiatric: She has a normal mood and affect. Her behavior is normal. Judgment and thought content normal.   BP 120/75   Pulse 79   Temp (!) 96.9 F (36.1 C) (Oral)   Ht 5' 9" (1.753 m)   Wt 178 lb (80.7 kg)   BMI 26.29 kg/m         Assessment & Plan:  Mary Park comes in today with chief complaint of Annual Exam   Diagnosis and orders addressed:  1. Annual physical exam - Urinalysis, Complete - CMP14+EGFR - Lipid panel - CBC with Differential/Platelet - Thyroid Panel With TSH  2. Gynecologic exam normal - IGP, Aptima HPV, rfx 16/18,45   Labs pending Health Maintenance reviewed Diet and exercise encouraged  Follow up plan: 1 year   Mary Martin, FNP  

## 2017-07-27 NOTE — Patient Instructions (Signed)

## 2017-07-28 LAB — CMP14+EGFR
A/G RATIO: 1.4 (ref 1.2–2.2)
ALK PHOS: 71 IU/L (ref 39–117)
ALT: 37 IU/L — AB (ref 0–32)
AST: 32 IU/L (ref 0–40)
Albumin: 4.2 g/dL (ref 3.5–5.5)
BILIRUBIN TOTAL: 0.3 mg/dL (ref 0.0–1.2)
BUN/Creatinine Ratio: 17 (ref 9–23)
BUN: 10 mg/dL (ref 6–24)
CHLORIDE: 106 mmol/L (ref 96–106)
CO2: 20 mmol/L (ref 20–29)
Calcium: 10 mg/dL (ref 8.7–10.2)
Creatinine, Ser: 0.59 mg/dL (ref 0.57–1.00)
GFR calc Af Amer: 124 mL/min/{1.73_m2} (ref >59)
GFR calc non Af Amer: 107 mL/min/{1.73_m2} (ref >59)
Globulin, Total: 2.9 g/dL (ref 1.5–4.5)
Glucose: 85 mg/dL (ref 65–99)
POTASSIUM: 4.5 mmol/L (ref 3.5–5.2)
Sodium: 137 mmol/L (ref 134–144)
Total Protein: 7.1 g/dL (ref 6.0–8.5)

## 2017-07-28 LAB — LIPID PANEL
Chol/HDL Ratio: 2.2 ratio (ref 0.0–4.4)
Cholesterol, Total: 161 mg/dL (ref 100–199)
HDL: 72 mg/dL (ref >39)
LDL Calculated: 72 mg/dL (ref 0–99)
TRIGLYCERIDES: 86 mg/dL (ref 0–149)
VLDL Cholesterol Cal: 17 mg/dL (ref 5–40)

## 2017-07-28 LAB — CBC WITH DIFFERENTIAL/PLATELET
Basophils Absolute: 0 10*3/uL (ref 0.0–0.2)
Basos: 0 %
EOS (ABSOLUTE): 0 10*3/uL (ref 0.0–0.4)
EOS: 1 %
HEMATOCRIT: 31.5 % — AB (ref 34.0–46.6)
HEMOGLOBIN: 9.8 g/dL — AB (ref 11.1–15.9)
IMMATURE GRANS (ABS): 0 10*3/uL (ref 0.0–0.1)
Immature Granulocytes: 0 %
LYMPHS ABS: 1.2 10*3/uL (ref 0.7–3.1)
LYMPHS: 23 %
MCH: 23 pg — ABNORMAL LOW (ref 26.6–33.0)
MCHC: 31.1 g/dL — AB (ref 31.5–35.7)
MCV: 74 fL — ABNORMAL LOW (ref 79–97)
MONOCYTES: 8 %
Monocytes Absolute: 0.4 10*3/uL (ref 0.1–0.9)
NEUTROS ABS: 3.5 10*3/uL (ref 1.4–7.0)
Neutrophils: 68 %
Platelets: 354 10*3/uL (ref 150–450)
RBC: 4.27 x10E6/uL (ref 3.77–5.28)
RDW: 16.9 % — ABNORMAL HIGH (ref 12.3–15.4)
WBC: 5.3 10*3/uL (ref 3.4–10.8)

## 2017-07-28 LAB — THYROID PANEL WITH TSH
FREE THYROXINE INDEX: 2 (ref 1.2–4.9)
T3 Uptake Ratio: 28 % (ref 24–39)
T4 TOTAL: 7 ug/dL (ref 4.5–12.0)
TSH: 1.61 u[IU]/mL (ref 0.450–4.500)

## 2017-07-31 LAB — IGP, APTIMA HPV, RFX 16/18,45
HPV Aptima: NEGATIVE
PAP SMEAR COMMENT: 0

## 2017-08-17 ENCOUNTER — Ambulatory Visit (HOSPITAL_COMMUNITY)
Admission: RE | Admit: 2017-08-17 | Discharge: 2017-08-17 | Disposition: A | Discharge status: Home / Self Care | Payer: BLUE CROSS/BLUE SHIELD | Source: Ambulatory Visit | Attending: Nurse Practitioner | Admitting: Nurse Practitioner

## 2017-08-17 ENCOUNTER — Encounter (HOSPITAL_COMMUNITY): Payer: Self-pay

## 2017-08-17 DIAGNOSIS — Z1231 Encounter for screening mammogram for malignant neoplasm of breast: Secondary | ICD-10-CM | POA: Diagnosis not present

## 2017-08-20 ENCOUNTER — Other Ambulatory Visit: Payer: Self-pay | Admitting: Nurse Practitioner

## 2017-08-20 DIAGNOSIS — R928 Other abnormal and inconclusive findings on diagnostic imaging of breast: Secondary | ICD-10-CM

## 2017-08-28 ENCOUNTER — Ambulatory Visit (HOSPITAL_COMMUNITY)
Admission: RE | Admit: 2017-08-28 | Discharge: 2017-08-28 | Disposition: A | Discharge status: Home / Self Care | Payer: BLUE CROSS/BLUE SHIELD | Source: Ambulatory Visit | Attending: Nurse Practitioner | Admitting: Nurse Practitioner

## 2017-08-28 DIAGNOSIS — N6314 Unspecified lump in the right breast, lower inner quadrant: Secondary | ICD-10-CM | POA: Diagnosis not present

## 2017-08-28 DIAGNOSIS — R928 Other abnormal and inconclusive findings on diagnostic imaging of breast: Secondary | ICD-10-CM | POA: Insufficient documentation

## 2017-08-28 DIAGNOSIS — R922 Inconclusive mammogram: Secondary | ICD-10-CM | POA: Diagnosis not present

## 2018-02-28 ENCOUNTER — Other Ambulatory Visit: Payer: Self-pay | Admitting: Nurse Practitioner

## 2018-02-28 DIAGNOSIS — N63 Unspecified lump in unspecified breast: Secondary | ICD-10-CM

## 2018-03-14 ENCOUNTER — Other Ambulatory Visit (HOSPITAL_COMMUNITY): Payer: BLUE CROSS/BLUE SHIELD

## 2018-03-14 ENCOUNTER — Ambulatory Visit (HOSPITAL_COMMUNITY): Admission: RE | Admit: 2018-03-14 | Payer: BLUE CROSS/BLUE SHIELD | Source: Ambulatory Visit

## 2018-03-21 DIAGNOSIS — J019 Acute sinusitis, unspecified: Secondary | ICD-10-CM | POA: Diagnosis not present

## 2018-03-21 DIAGNOSIS — H6692 Otitis media, unspecified, left ear: Secondary | ICD-10-CM | POA: Diagnosis not present

## 2018-03-21 DIAGNOSIS — R6889 Other general symptoms and signs: Secondary | ICD-10-CM | POA: Diagnosis not present

## 2018-03-21 DIAGNOSIS — R070 Pain in throat: Secondary | ICD-10-CM | POA: Diagnosis not present

## 2019-01-25 IMAGING — US US BREAST*R* LIMITED INC AXILLA
1 series · 6 of 6 positions shown · non-contrast
Comparison: Previous exam(s).

CLINICAL DATA: 52-year-old female recalled from screening mammogram
dated 08/17/2017 for possible right breast masses.

EXAM:
DIGITAL DIAGNOSTIC RIGHT MAMMOGRAM WITH CAD AND TOMO
ULTRASOUND RIGHT BREAST

[Series 1: us breast*right* limited inc axilla · 0.05mm/px · 6 of 6 slices shown]
[im 1/6]
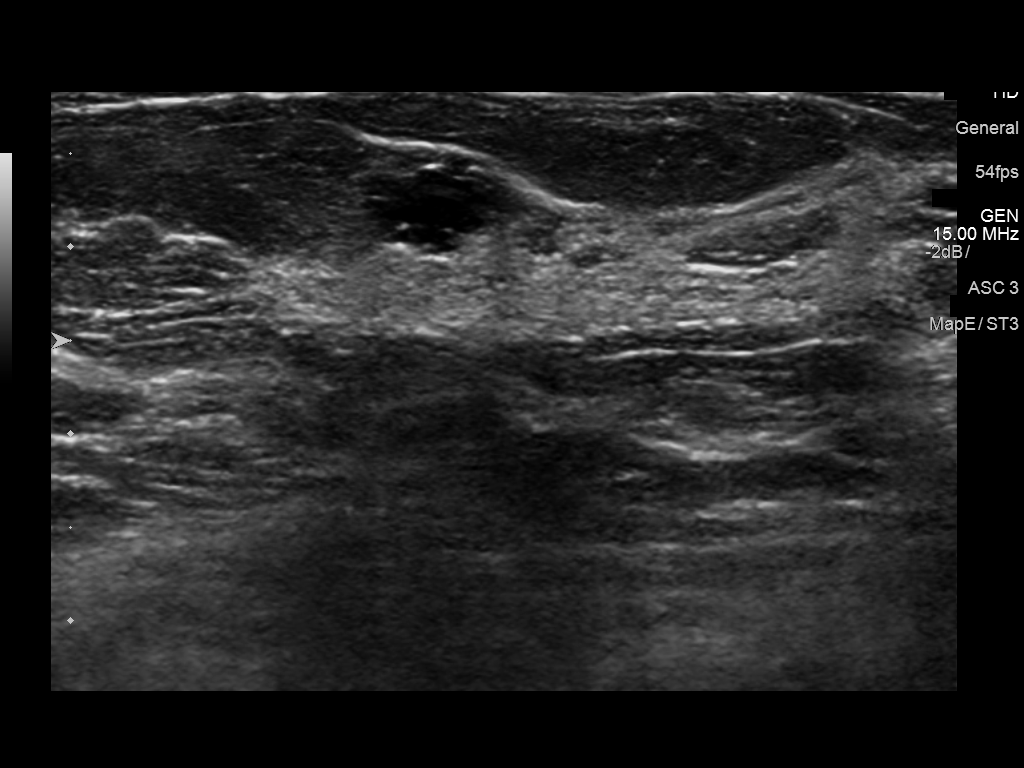
[im 2/6]
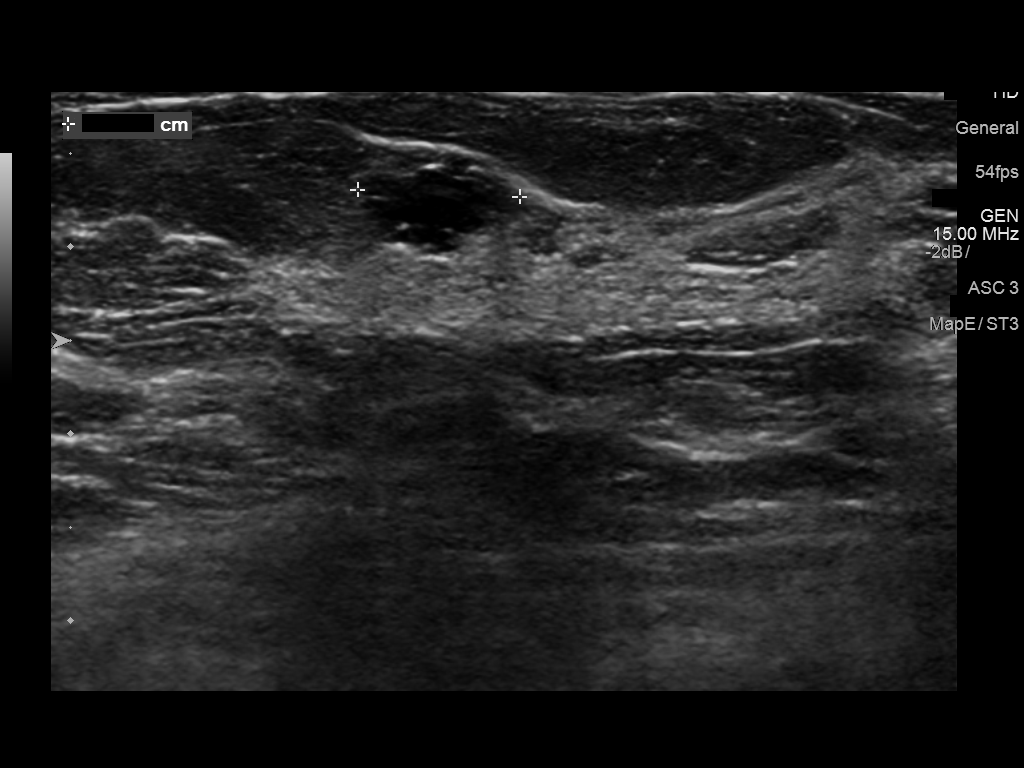
[im 3/6]
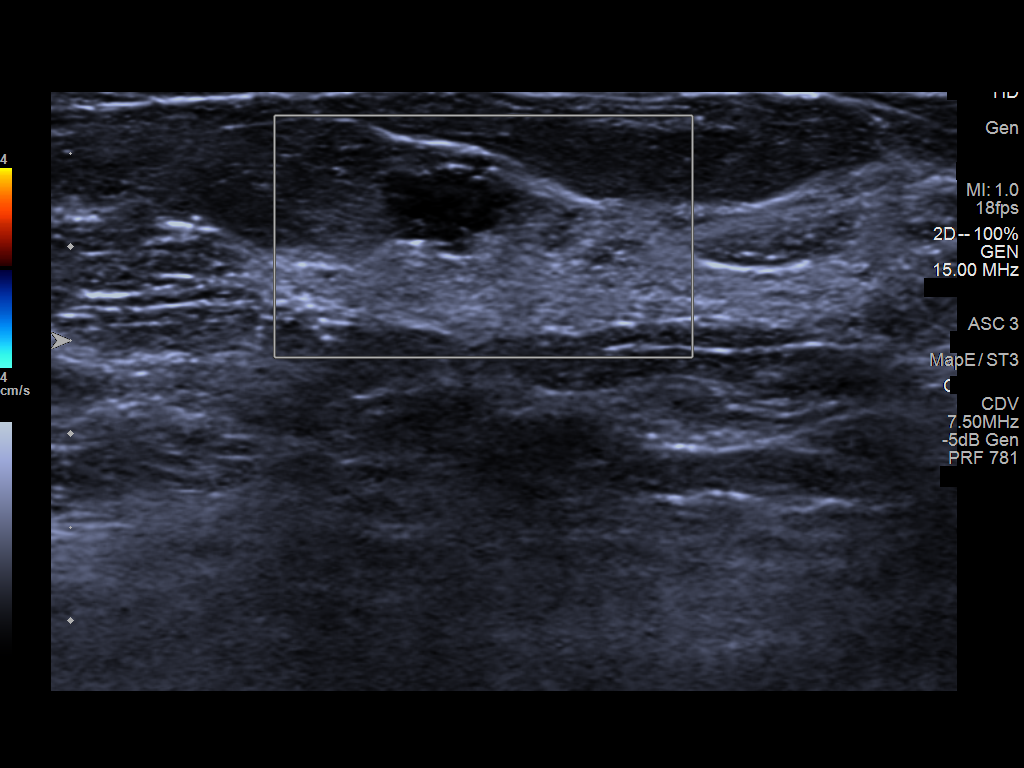
[im 4/6]
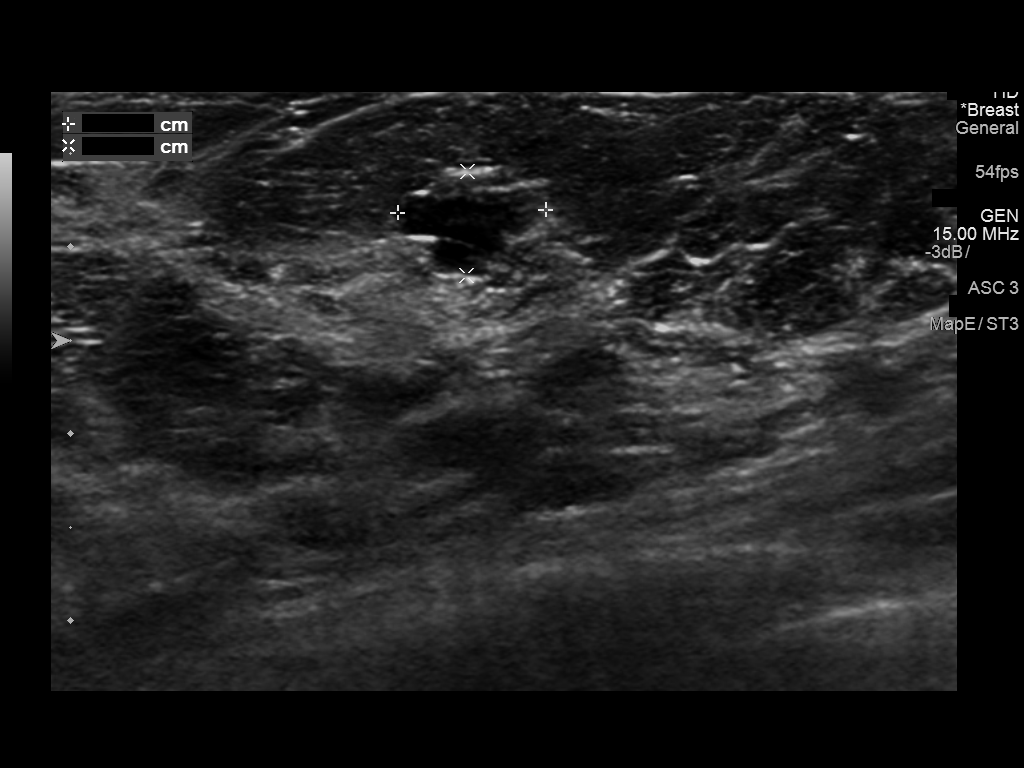
[im 5/6]
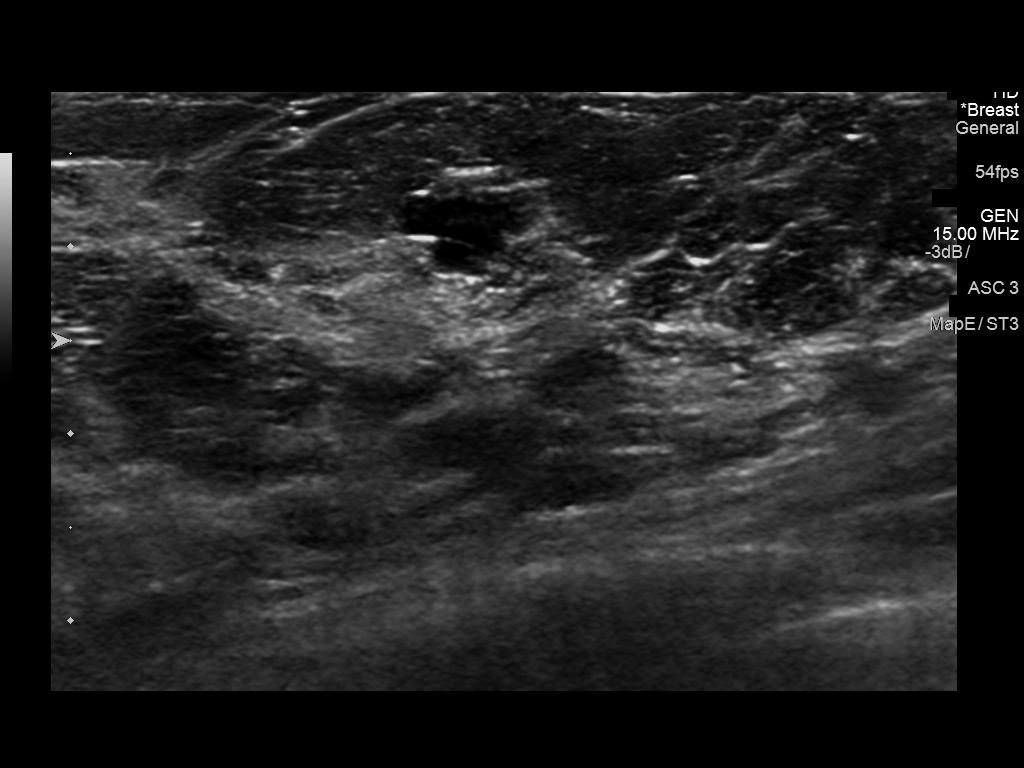
[im 6/6]
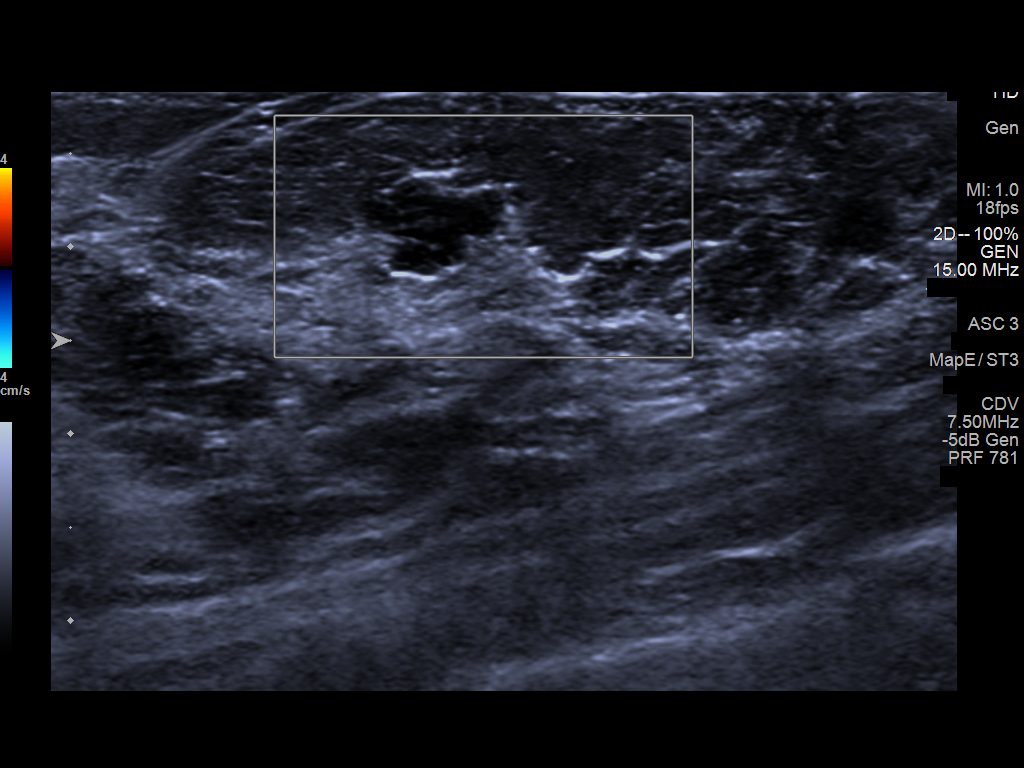

[6 of 6 positions shown; findings below may reference images not displayed]

ACR Breast Density Category c: The breast tissue is heterogeneously
dense, which may obscure small masses.
FINDINGS: The previously described, possible mass in the central posterior
right breast resolves into well dispersed fibroglandular tissue on
today's additional views. A circumscribed, lobulated mass persists
in the lower inner right breast at middle depth. Further evaluation
with ultrasound was performed.

Mammographic images were processed with CAD.

Targeted ultrasound is performed, showing a lobulated heterogeneous
mass at the 5 o'clock position 3 cm from the nipple. It measures
x 0.8 x 0.6 cm. There is no internal vascularity. This correlates
well with the mammographic finding and is most suggestive of a cyst
cluster.
IMPRESSION: Probably benign, probable right breast cyst cluster corresponding
with the screening mammographic findings. Recommendation is for
six-month ultrasound follow-up.

RECOMMENDATION:
Right breast ultrasound in 6 months.

I have discussed the findings and recommendations with the patient.
Results were also provided in writing at the conclusion of the
visit. If applicable, a reminder letter will be sent to the patient
regarding the next appointment.

BI-RADS CATEGORY  3: Probably benign.

## 2019-02-17 ENCOUNTER — Ambulatory Visit: Payer: Self-pay

## 2019-02-17 DIAGNOSIS — Z6827 Body mass index (BMI) 27.0-27.9, adult: Secondary | ICD-10-CM | POA: Diagnosis not present

## 2019-02-17 DIAGNOSIS — R300 Dysuria: Secondary | ICD-10-CM | POA: Diagnosis not present

## 2019-03-04 ENCOUNTER — Ambulatory Visit (INDEPENDENT_AMBULATORY_CARE_PROVIDER_SITE_OTHER): Payer: BC Managed Care – PPO | Admitting: Nurse Practitioner

## 2019-03-04 ENCOUNTER — Other Ambulatory Visit: Payer: Self-pay

## 2019-03-04 ENCOUNTER — Encounter: Payer: Self-pay | Admitting: Nurse Practitioner

## 2019-03-04 ENCOUNTER — Ambulatory Visit (INDEPENDENT_AMBULATORY_CARE_PROVIDER_SITE_OTHER): Payer: BC Managed Care – PPO

## 2019-03-04 VITALS — BP 116/77 | HR 91 | Temp 98.9°F | Resp 20 | Ht 69.0 in | Wt 180.0 lb

## 2019-03-04 DIAGNOSIS — Z0000 Encounter for general adult medical examination without abnormal findings: Secondary | ICD-10-CM

## 2019-03-04 DIAGNOSIS — Z13820 Encounter for screening for osteoporosis: Secondary | ICD-10-CM

## 2019-03-04 DIAGNOSIS — Z780 Asymptomatic menopausal state: Secondary | ICD-10-CM | POA: Diagnosis not present

## 2019-03-04 DIAGNOSIS — Z1212 Encounter for screening for malignant neoplasm of rectum: Secondary | ICD-10-CM

## 2019-03-04 DIAGNOSIS — Z1211 Encounter for screening for malignant neoplasm of colon: Secondary | ICD-10-CM | POA: Diagnosis not present

## 2019-03-04 NOTE — Progress Notes (Signed)
Subjective:    Patient ID: Miangel Flom, female    DOB: 12-18-1965, 54 y.o.   MRN: [id number]   Chief Complaint: Annual Exam (No pap)   HPI Patient comes in  Today for annual physical. Her last PAP was done 07/27/17 and was normal. It is not time for repeat PAP at this time. Patient has no medical problems and is on no medications.   Review of Systems  Constitutional: Negative for diaphoresis.  Eyes: Negative for pain.  Respiratory: Negative for shortness of breath.   Cardiovascular: Negative for chest pain, palpitations and leg swelling.  Gastrointestinal: Negative for abdominal pain.  Endocrine: Negative for polydipsia.  Skin: Negative for rash.  Neurological: Negative for dizziness, weakness and headaches.  Hematological: Does not bruise/bleed easily.  All other systems reviewed and are negative.      Objective:   Physical Exam Vitals and nursing note reviewed.  Constitutional:      General: She is not in acute distress.    Appearance: Normal appearance. She is well-developed.  HENT:     Head: Normocephalic.     Nose: Nose normal.  Eyes:     Pupils: Pupils are equal, round, and reactive to light.  Neck:     Vascular: No carotid bruit or JVD.  Cardiovascular:     Rate and Rhythm: Normal rate and regular rhythm.     Heart sounds: Normal heart sounds.  Pulmonary:     Effort: Pulmonary effort is normal. No respiratory distress.     Breath sounds: Normal breath sounds. No wheezing or rales.  Chest:     Chest wall: No tenderness.  Abdominal:     General: Bowel sounds are normal. There is no distension or abdominal bruit.     Palpations: Abdomen is soft. There is no hepatomegaly, splenomegaly, mass or pulsatile mass.     Tenderness: There is no abdominal tenderness.  Musculoskeletal:        General: Normal range of motion.     Cervical back: Normal range of motion and neck supple.  Lymphadenopathy:     Cervical: No cervical adenopathy.  Skin:    General: Skin  is warm and dry.  Neurological:     Mental Status: She is alert and oriented to person, place, and time.     Deep Tendon Reflexes: Reflexes are normal and symmetric.  Psychiatric:        Behavior: Behavior normal.        Thought Content: Thought content normal.        Judgment: Judgment normal.    BP 116/77   Pulse 91   Temp 98.9 F (37.2 C) (Temporal)   Resp 20   Ht _0  (1.753 m)   Wt 180 lb (81.6 kg)   SpO2 100%   BMI 26.58 kg/m   ekg- NSR-Mary-Margaret Hassell Done, FNP  Chest xray - no cardiopulmonary abnormalities-Preliminary reading by Ronnald Collum, FNP  [facility]       Assessment & Plan:  Lizanne Erker comes in today with chief complaint of Annual Exam (No pap)   Diagnosis and orders addressed:  1. Annual physical exam - CBC with Differential/Platelet - CMP14+EGFR - Lipid panel - Thyroid Panel With TSH - DG Chest 2 View; Future - EKG 12-Lead - DG [facility] DEXA  2. Osteoporosis screening Weight bearing exercises Will discuss dexascan results when recieved  3. Encounter for screening for colorectal malignant neoplasm - Cologuard- patient refuses colonoscopy   Labs pending Health Maintenance reviewed Diet and exercise  encouraged  Follow up plan: 1 year   Mary-Margaret Hassell Done, FNP

## 2019-03-04 NOTE — Patient Instructions (Signed)
  Cologuard  Your provider has prescribed Cologuard, an easy-to-use, noninvasive test for colon cancer screening, based on the latest advances in stool DNA science.   Here's what will happen next:  1. You may receive a call or email from [employer] to confirm your mailing address and insurance information 2. Your kit will be shipped directly to you 3. You collect your stool sample in the privacy of your own home. Please follow the instructions that come with the kit 4. You return the kit via [employer] shipping or pick-up, in the same box it arrived in 5. You should receive a call with the results once they are available. If you do not receive a call, please contact our office at [phone]  Insurance Coverage  Cologuard is covered by Medicare and most major insurers Cologuard is covered by Medicare and Medicare Advantage with no co-pay or deductible for eligible patients ages 19-85. [employer], more than 94% of Cologuard patients have no out-of-pocket cost for screening. . Based on the [place] should be covered by most private insurers with no co-pay or deductible for eligible patients (ages 13-75; at average risk for colon cancer; without symptoms). Currently, ~74% of Cologuard patients 45-49 have had no out-of-pocket cost for screening.  . Many national and regional payers have begun paying for CRC screening at 45. [employer] continues to work with payers to expand coverage and access for patients ages 38-49.   Only your healthcare insurance provider can confirm how Cologuard will be covered for you. If you have questions about coverage, you can contact your insurance company directly or ask the specialists at [employer] to do that for you. A Customer Support Specialist can be reached at [phone].    Patient Support Screening for colon cancer is very important to your good health, so if you have any questions at all, please call [employer], commercial Customer Support Specialists at [phone]. They are available 24 hours a day, 6 days a week. An instructional video is available to view online at [email or web address]   *Information and graphics obtained from [email or web address]

## 2019-03-05 LAB — CMP14+EGFR
ALT: 14 IU/L (ref 0–32)
AST: 17 IU/L (ref 0–40)
Albumin/Globulin Ratio: 1.6 (ref 1.2–2.2)
Albumin: 4.5 g/dL (ref 3.8–4.9)
Alkaline Phosphatase: 80 IU/L (ref 39–117)
BUN/Creatinine Ratio: 14 (ref 9–23)
BUN: 8 mg/dL (ref 6–24)
Bilirubin Total: 0.3 mg/dL (ref 0.0–1.2)
CO2: 21 mmol/L (ref 20–29)
Calcium: 10.9 mg/dL — ABNORMAL HIGH (ref 8.7–10.2)
Chloride: 104 mmol/L (ref 96–106)
Creatinine, Ser: 0.58 mg/dL (ref 0.57–1.00)
GFR calc Af Amer: 123 mL/min/1.73
GFR calc non Af Amer: 107 mL/min/1.73
Globulin, Total: 2.8 g/dL (ref 1.5–4.5)
Glucose: 83 mg/dL (ref 65–99)
Potassium: 3.7 mmol/L (ref 3.5–5.2)
Sodium: 138 mmol/L (ref 134–144)
Total Protein: 7.3 g/dL (ref 6.0–8.5)

## 2019-03-05 LAB — CBC WITH DIFFERENTIAL/PLATELET
Basophils Absolute: 0 10*3/uL (ref 0.0–0.2)
Basos: 0 %
EOS (ABSOLUTE): 0 10*3/uL (ref 0.0–0.4)
Eos: 1 %
Hematocrit: 31.1 % — ABNORMAL LOW (ref 34.0–46.6)
Hemoglobin: 9.3 g/dL — ABNORMAL LOW (ref 11.1–15.9)
Immature Grans (Abs): 0 10*3/uL (ref 0.0–0.1)
Immature Granulocytes: 0 %
Lymphocytes Absolute: 1.4 10*3/uL (ref 0.7–3.1)
Lymphs: 32 %
MCH: 22.4 pg — ABNORMAL LOW (ref 26.6–33.0)
MCHC: 29.9 g/dL — ABNORMAL LOW (ref 31.5–35.7)
MCV: 75 fL — ABNORMAL LOW (ref 79–97)
Monocytes Absolute: 0.4 10*3/uL (ref 0.1–0.9)
Monocytes: 10 %
Neutrophils Absolute: 2.6 10*3/uL (ref 1.4–7.0)
Neutrophils: 57 %
Platelets: 370 10*3/uL (ref 150–450)
RBC: 4.15 x10E6/uL (ref 3.77–5.28)
RDW: 15.2 % (ref 11.7–15.4)
WBC: 4.5 10*3/uL (ref 3.4–10.8)

## 2019-03-05 LAB — LIPID PANEL
Chol/HDL Ratio: 2.2 ratio (ref 0.0–4.4)
Cholesterol, Total: 160 mg/dL (ref 100–199)
HDL: 72 mg/dL
LDL Chol Calc (NIH): 76 mg/dL (ref 0–99)
Triglycerides: 57 mg/dL (ref 0–149)
VLDL Cholesterol Cal: 12 mg/dL (ref 5–40)

## 2019-03-05 LAB — THYROID PANEL WITH TSH
Free Thyroxine Index: 2 (ref 1.2–4.9)
T3 Uptake Ratio: 27 % (ref 24–39)
T4, Total: 7.3 ug/dL (ref 4.5–12.0)
TSH: 1.74 u[IU]/mL (ref 0.450–4.500)

## 2019-04-08 DIAGNOSIS — Z1212 Encounter for screening for malignant neoplasm of rectum: Secondary | ICD-10-CM | POA: Diagnosis not present

## 2019-04-23 LAB — COLOGUARD: Cologuard: NEGATIVE

## 2019-06-28 DIAGNOSIS — Z23 Encounter for immunization: Secondary | ICD-10-CM | POA: Diagnosis not present

## 2019-07-25 DIAGNOSIS — Z23 Encounter for immunization: Secondary | ICD-10-CM | POA: Diagnosis not present

## 2019-12-15 ENCOUNTER — Other Ambulatory Visit (HOSPITAL_COMMUNITY): Payer: Self-pay | Admitting: Nurse Practitioner

## 2019-12-15 DIAGNOSIS — N6011 Diffuse cystic mastopathy of right breast: Secondary | ICD-10-CM

## 2020-01-01 DIAGNOSIS — R109 Unspecified abdominal pain: Secondary | ICD-10-CM | POA: Diagnosis not present

## 2020-01-01 DIAGNOSIS — K529 Noninfective gastroenteritis and colitis, unspecified: Secondary | ICD-10-CM | POA: Diagnosis not present

## 2020-01-01 DIAGNOSIS — R197 Diarrhea, unspecified: Secondary | ICD-10-CM | POA: Diagnosis not present

## 2020-01-01 DIAGNOSIS — Z6825 Body mass index (BMI) 25.0-25.9, adult: Secondary | ICD-10-CM | POA: Diagnosis not present

## 2020-01-06 ENCOUNTER — Ambulatory Visit (HOSPITAL_COMMUNITY)
Admission: RE | Admit: 2020-01-06 | Discharge: 2020-01-06 | Disposition: A | Discharge status: Home / Self Care | Payer: BC Managed Care – PPO | Source: Ambulatory Visit | Attending: Nurse Practitioner | Admitting: Nurse Practitioner

## 2020-01-06 ENCOUNTER — Other Ambulatory Visit: Payer: Self-pay

## 2020-01-06 DIAGNOSIS — N6011 Diffuse cystic mastopathy of right breast: Secondary | ICD-10-CM

## 2020-01-06 DIAGNOSIS — R922 Inconclusive mammogram: Secondary | ICD-10-CM | POA: Diagnosis not present

## 2020-08-04 IMAGING — MG DIGITAL DIAGNOSTIC UNILATERAL RIGHT MAMMOGRAM WITH TOMO AND CAD
6 series · 6 of 18 positions shown · non-contrast
Comparison: Previous exam(s).

CLINICAL DATA: 52-year-old female recalled from screening mammogram
dated 08/17/2017 for possible right breast masses.

EXAM:
DIGITAL DIAGNOSTIC RIGHT MAMMOGRAM WITH CAD AND TOMO
ULTRASOUND RIGHT BREAST

[R CC synth-2D]
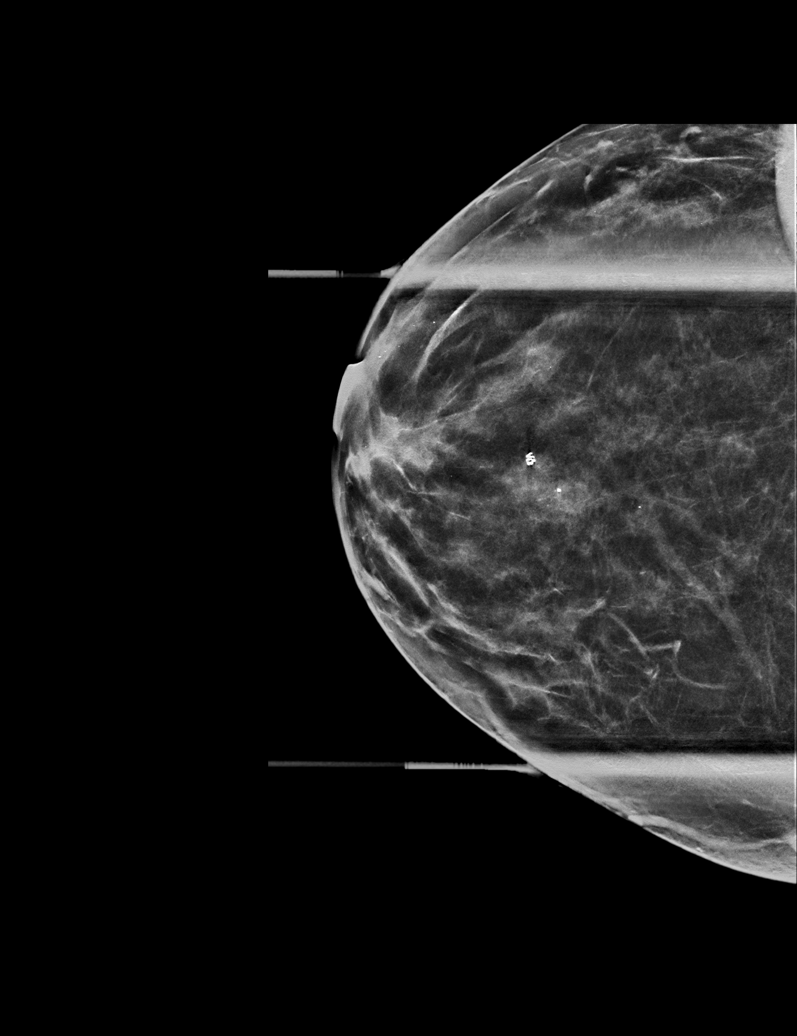

[R ML synth-2D]
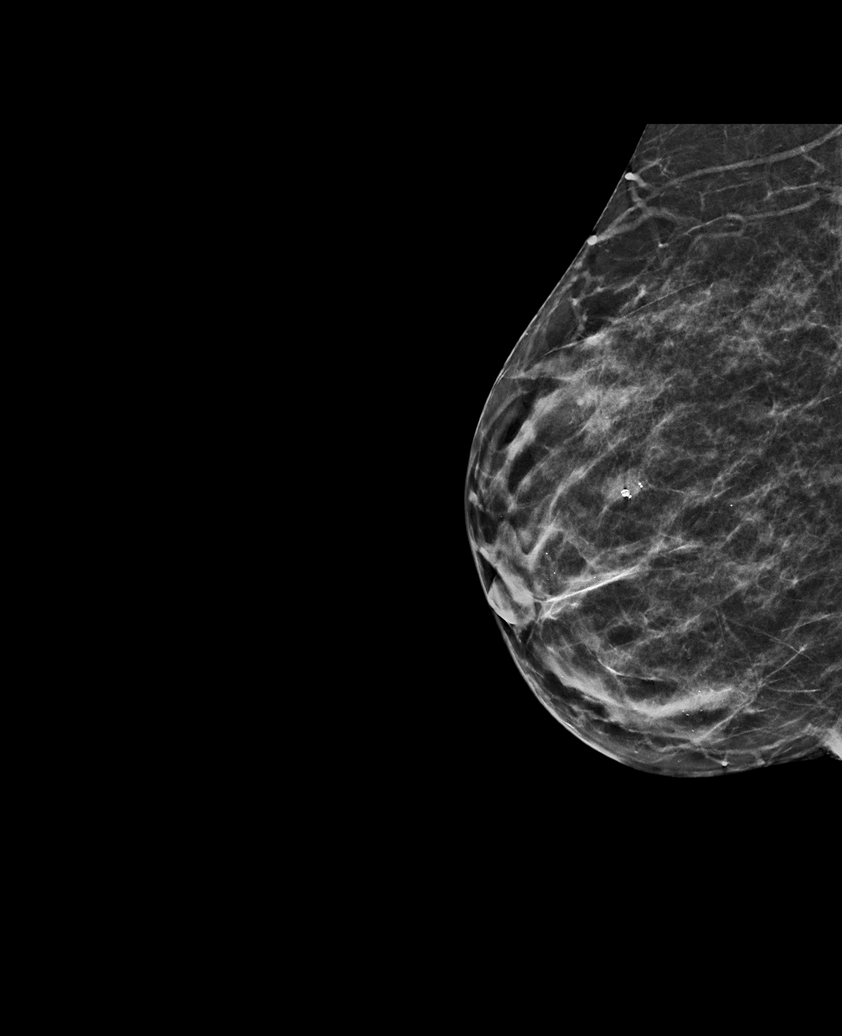

[R XCCL synth-2D]
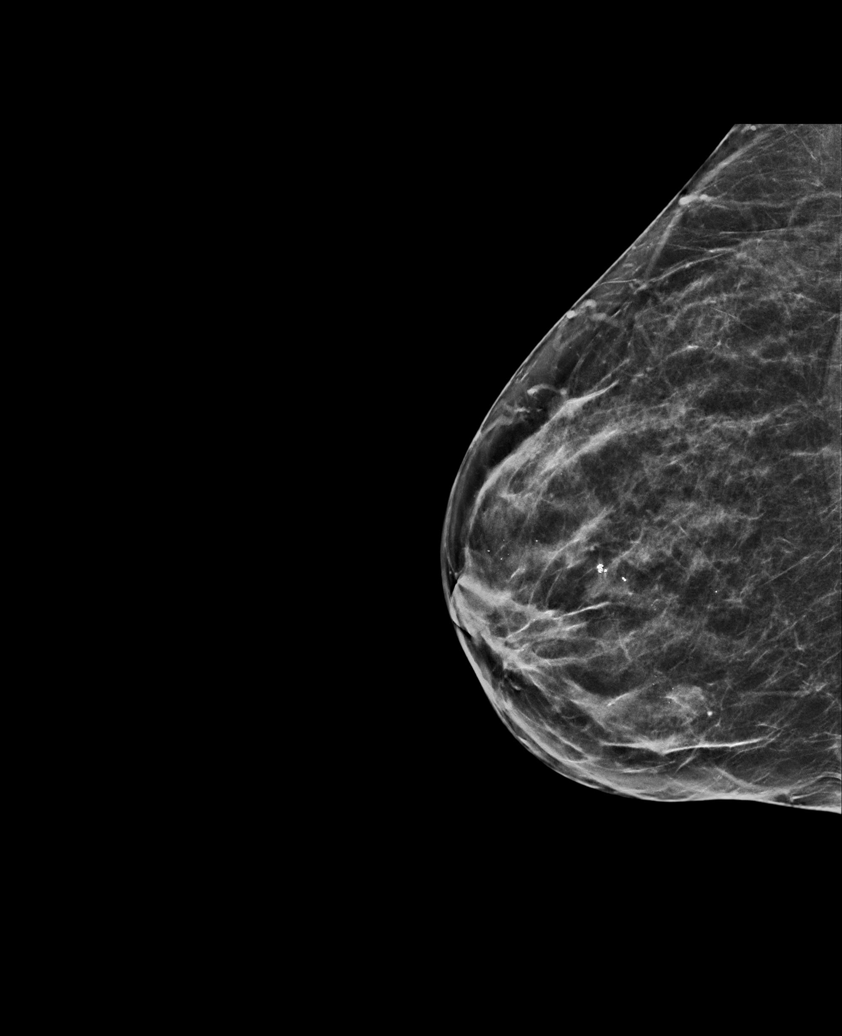

[R XCCL tomo · tomo slice 31/60.0]
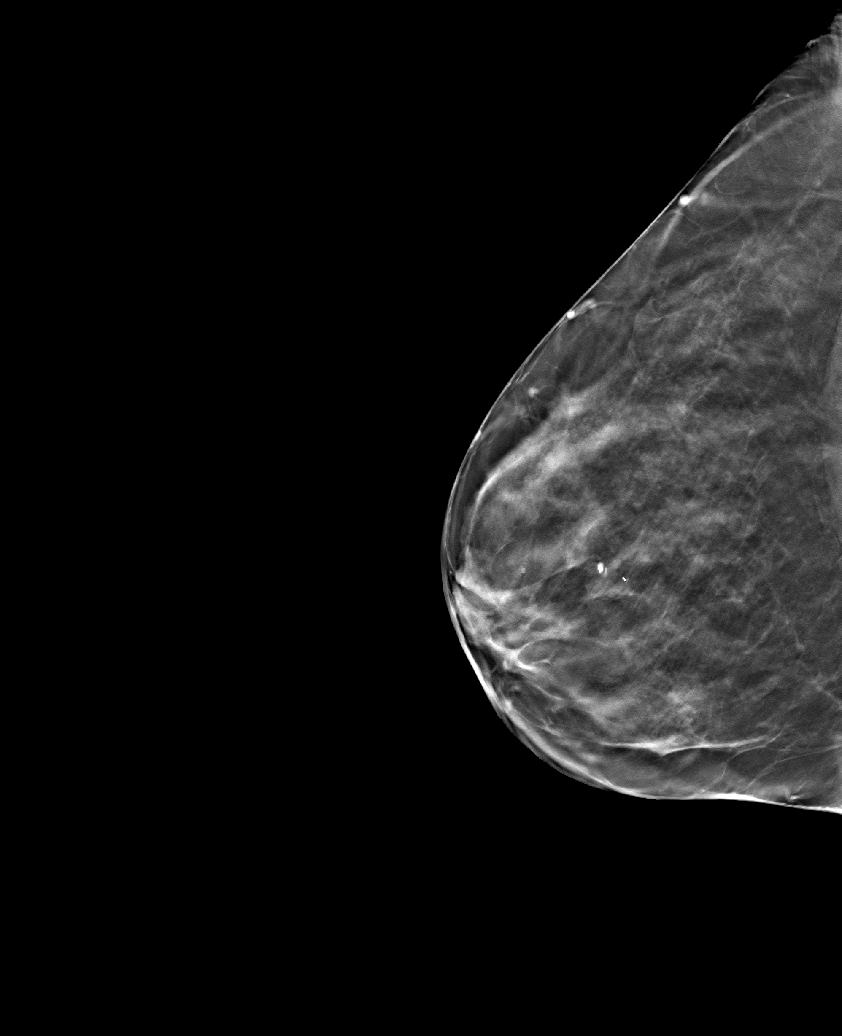

[R CC tomo · tomo slice 27/53.0]
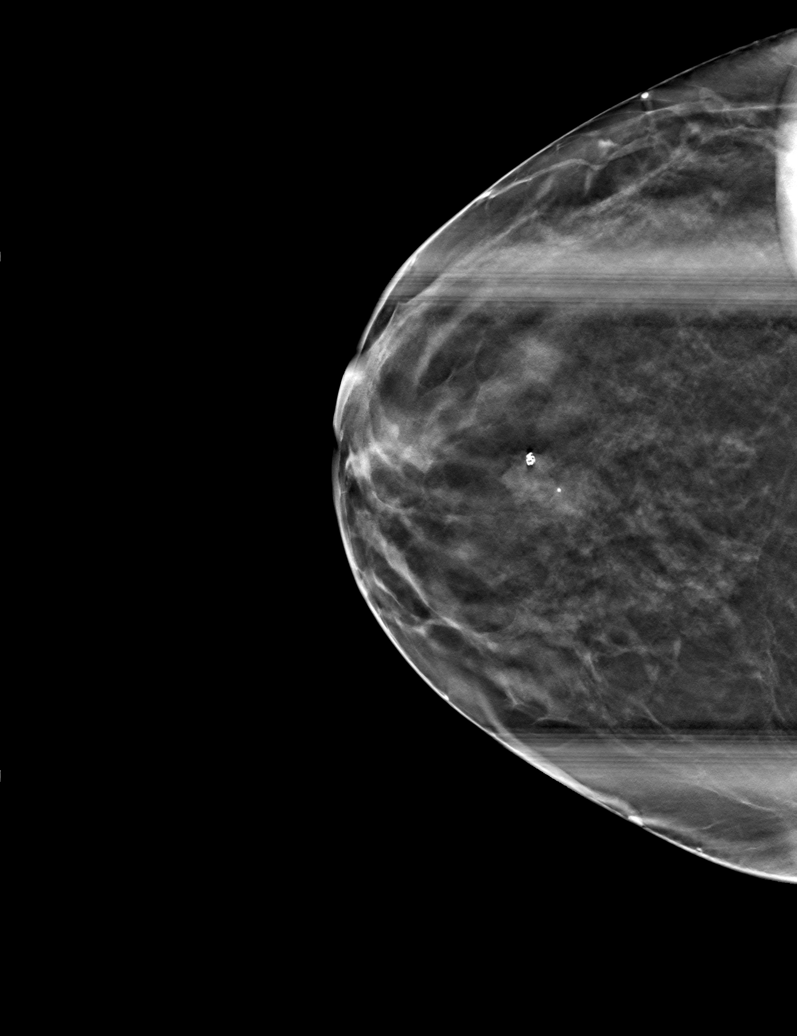

[R ML tomo · tomo slice 27/53.0]
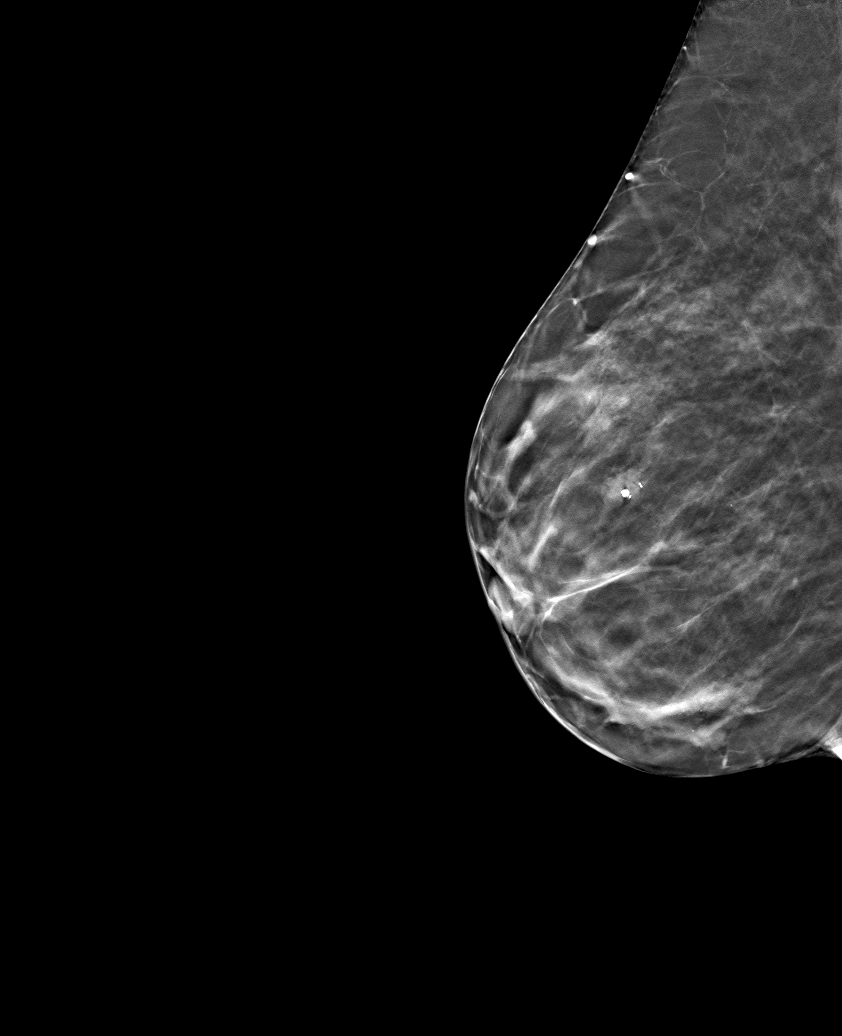

[6 of 18 positions shown; findings below may reference images not displayed]

ACR Breast Density Category c: The breast tissue is heterogeneously
dense, which may obscure small masses.
FINDINGS: The previously described, possible mass in the central posterior
right breast resolves into well dispersed fibroglandular tissue on
today's additional views. A circumscribed, lobulated mass persists
in the lower inner right breast at middle depth. Further evaluation
with ultrasound was performed.

Mammographic images were processed with CAD.

Targeted ultrasound is performed, showing a lobulated heterogeneous
mass at the 5 o'clock position 3 cm from the nipple. It measures
x 0.8 x 0.6 cm. There is no internal vascularity. This correlates
well with the mammographic finding and is most suggestive of a cyst
cluster.
IMPRESSION: Probably benign, probable right breast cyst cluster corresponding
with the screening mammographic findings. Recommendation is for
six-month ultrasound follow-up.

RECOMMENDATION:
Right breast ultrasound in 6 months.

I have discussed the findings and recommendations with the patient.
Results were also provided in writing at the conclusion of the
visit. If applicable, a reminder letter will be sent to the patient
regarding the next appointment.

BI-RADS CATEGORY  3: Probably benign.

## 2021-02-04 ENCOUNTER — Encounter: Payer: Self-pay | Admitting: Nurse Practitioner

## 2021-02-04 ENCOUNTER — Ambulatory Visit (INDEPENDENT_AMBULATORY_CARE_PROVIDER_SITE_OTHER): Payer: BC Managed Care – PPO | Admitting: Nurse Practitioner

## 2021-02-04 VITALS — BP 131/80 | HR 100 | Temp 97.1°F | Resp 20 | Ht 69.0 in | Wt 186.0 lb

## 2021-02-04 DIAGNOSIS — M25512 Pain in left shoulder: Secondary | ICD-10-CM | POA: Diagnosis not present

## 2021-02-04 DIAGNOSIS — Z0001 Encounter for general adult medical examination with abnormal findings: Secondary | ICD-10-CM | POA: Diagnosis not present

## 2021-02-04 DIAGNOSIS — Z0000 Encounter for general adult medical examination without abnormal findings: Secondary | ICD-10-CM | POA: Diagnosis not present

## 2021-02-04 LAB — URINALYSIS, COMPLETE
Bilirubin, UA: NEGATIVE
Glucose, UA: NEGATIVE
Ketones, UA: NEGATIVE
Leukocytes,UA: NEGATIVE
Nitrite, UA: NEGATIVE
Protein,UA: NEGATIVE
Specific Gravity, UA: 1.02 (ref 1.005–1.030)
Urobilinogen, Ur: 0.2 mg/dL (ref 0.2–1.0)
pH, UA: 7 (ref 5.0–7.5)

## 2021-02-04 LAB — MICROSCOPIC EXAMINATION
Bacteria, UA: NONE SEEN
Epithelial Cells (non renal): NONE SEEN /hpf (ref 0–10)
RBC: NONE SEEN /hpf (ref 0–2)

## 2021-02-04 MED ORDER — PREDNISONE 10 MG (21) PO TBPK: ORAL_TABLET | ORAL | 0 refills | Status: DC

## 2021-02-04 NOTE — Progress Notes (Signed)
Subjective:    Patient ID: Mary Park, female    DOB: 11/25/1965, 56 y.o.   MRN: [id number]  HPI Patient comes in  today for annual physical exam. She has no chronic medical problems and is on no regular meds. She is doing well. Her only complaint today is left shoulder pain. Not sure of cause. Pain on lifting and trying to put her bra on. Has been hurting for about 2-3 weeks. Rate spoain 4-5/10 Wt Readings from Last 3 Encounters:  02/04/21 186 lb (84.4 kg)  03/04/19 180 lb (81.6 kg)  07/27/17 178 lb (80.7 kg)      Review of Systems  Constitutional:  Negative for diaphoresis.  Eyes:  Negative for pain.  Respiratory:  Negative for shortness of breath.   Cardiovascular:  Negative for chest pain, palpitations and leg swelling.  Gastrointestinal:  Negative for abdominal pain.  Endocrine: Negative for polydipsia.  Skin:  Negative for rash.  Neurological:  Negative for dizziness, weakness and headaches.  Hematological:  Does not bruise/bleed easily.  All other systems reviewed and are negative.     Objective:   Physical Exam Vitals and nursing note reviewed.  Constitutional:      General: She is not in acute distress.    Appearance: Normal appearance. She is well-developed.  HENT:     Head: Normocephalic.     Right Ear: Tympanic membrane normal.     Left Ear: Tympanic membrane normal.     Nose: Nose normal.     Mouth/Throat:     Mouth: Mucous membranes are moist.  Eyes:     Pupils: Pupils are equal, round, and reactive to light.  Neck:     Vascular: No carotid bruit or JVD.  Cardiovascular:     Rate and Rhythm: Normal rate and regular rhythm.     Heart sounds: Normal heart sounds.  Pulmonary:     Effort: Pulmonary effort is normal. No respiratory distress.     Breath sounds: Normal breath sounds. No wheezing or rales.  Chest:     Chest wall: No tenderness.  Abdominal:     General: Bowel sounds are normal. There is no distension or abdominal bruit.      Palpations: Abdomen is soft. There is no hepatomegaly, splenomegaly, mass or pulsatile mass.     Tenderness: There is no abdominal tenderness.  Genitourinary:    General: Normal vulva.     Vagina: No vaginal discharge.     Rectum: Normal.     Comments: Cervix non parous and pink  No adnexal masses or tenderness Musculoskeletal:        General: Normal range of motion.     Cervical back: Normal range of motion and neck supple.     Comments: Left shouder pain with full extension and internal rotation.  Lymphadenopathy:     Cervical: No cervical adenopathy.  Skin:    General: Skin is warm and dry.  Neurological:     Mental Status: She is alert and oriented to person, place, and time.     Deep Tendon Reflexes: Reflexes are normal and symmetric.  Psychiatric:        Behavior: Behavior normal.        Thought Content: Thought content normal.        Judgment: Judgment normal.   BP 131/80    Pulse 100    Temp (!) 97.1 F (36.2 C) (Temporal)    Resp 20    Ht _0  (1.753 m)  Wt 186 lb (84.4 kg)    SpO2 100%    BMI 27.47 kg/m         Assessment & Plan:  Mary Park comes in today with chief complaint of Annual Exam   Diagnosis and orders addressed:  1. Annual physical exam  - Urinalysis, Complete - CBC with Differential/Platelet - CMP14+EGFR - Lipid panel - Thyroid Panel With TSH - Cytology - PAP  2. Acute pain of left shoulder Moist heat  Rest If not improving will do ortho referral - predniSONE (STERAPRED UNI-PAK 21 TAB) 10 MG (21) TBPK tablet; As directed x 6 days  Dispense: 21 tablet; Refill: 0   Labs pending Health Maintenance reviewed Diet and exercise encouraged  Follow up plan: 1 year and prn   Mary-Margaret Hassell Done, FNP

## 2021-02-04 NOTE — Patient Instructions (Signed)

## 2021-02-05 LAB — CBC WITH DIFFERENTIAL/PLATELET
Basophils Absolute: 0 10*3/uL (ref 0.0–0.2)
Basos: 0 %
EOS (ABSOLUTE): 0.1 10*3/uL (ref 0.0–0.4)
Eos: 1 %
Hematocrit: 38.3 % (ref 34.0–46.6)
Hemoglobin: 12.9 g/dL (ref 11.1–15.9)
Immature Grans (Abs): 0 10*3/uL (ref 0.0–0.1)
Immature Granulocytes: 0 %
Lymphocytes Absolute: 1.5 10*3/uL (ref 0.7–3.1)
Lymphs: 29 %
MCH: 29.1 pg (ref 26.6–33.0)
MCHC: 33.7 g/dL (ref 31.5–35.7)
MCV: 87 fL (ref 79–97)
Monocytes Absolute: 0.4 10*3/uL (ref 0.1–0.9)
Monocytes: 8 %
Neutrophils Absolute: 3 10*3/uL (ref 1.4–7.0)
Neutrophils: 62 %
Platelets: 283 10*3/uL (ref 150–450)
RBC: 4.43 x10E6/uL (ref 3.77–5.28)
RDW: 13.3 % (ref 11.7–15.4)
WBC: 4.9 10*3/uL (ref 3.4–10.8)

## 2021-02-05 LAB — LIPID PANEL
Chol/HDL Ratio: 2.4 ratio (ref 0.0–4.4)
Cholesterol, Total: 182 mg/dL (ref 100–199)
HDL: 75 mg/dL (ref >39)
LDL Chol Calc (NIH): 91 mg/dL (ref 0–99)
Triglycerides: 91 mg/dL (ref 0–149)
VLDL Cholesterol Cal: 16 mg/dL (ref 5–40)

## 2021-02-05 LAB — CMP14+EGFR
ALT: 15 IU/L (ref 0–32)
AST: 17 IU/L (ref 0–40)
Albumin/Globulin Ratio: 1.6 (ref 1.2–2.2)
Albumin: 4.6 g/dL (ref 3.8–4.9)
Alkaline Phosphatase: 90 IU/L (ref 44–121)
BUN/Creatinine Ratio: 21 (ref 9–23)
BUN: 12 mg/dL (ref 6–24)
Bilirubin Total: 0.4 mg/dL (ref 0.0–1.2)
CO2: 23 mmol/L (ref 20–29)
Calcium: 11.1 mg/dL — ABNORMAL HIGH (ref 8.7–10.2)
Chloride: 106 mmol/L (ref 96–106)
Creatinine, Ser: 0.58 mg/dL (ref 0.57–1.00)
Globulin, Total: 2.9 g/dL (ref 1.5–4.5)
Glucose: 85 mg/dL (ref 70–99)
Potassium: 4.2 mmol/L (ref 3.5–5.2)
Sodium: 141 mmol/L (ref 134–144)
Total Protein: 7.5 g/dL (ref 6.0–8.5)
eGFR: 108 mL/min/{1.73_m2} (ref >59)

## 2021-02-05 LAB — THYROID PANEL WITH TSH
Free Thyroxine Index: 2.1 (ref 1.2–4.9)
T3 Uptake Ratio: 27 % (ref 24–39)
T4, Total: 7.7 ug/dL (ref 4.5–12.0)
TSH: 1.64 u[IU]/mL (ref 0.450–4.500)

## 2021-02-10 LAB — CYTOLOGY - PAP: Adequacy: ABNORMAL

## 2021-02-15 ENCOUNTER — Encounter: Payer: BC Managed Care – PPO | Admitting: Nurse Practitioner

## 2021-02-15 ENCOUNTER — Encounter: Payer: Self-pay | Admitting: Nurse Practitioner

## 2021-02-15 ENCOUNTER — Other Ambulatory Visit (HOSPITAL_COMMUNITY)
Admission: RE | Admit: 2021-02-15 | Discharge: 2021-02-15 | Disposition: A | Discharge status: Home / Self Care | Payer: BC Managed Care – PPO | Source: Ambulatory Visit | Attending: Nurse Practitioner | Admitting: Nurse Practitioner

## 2021-02-15 VITALS — BP 133/76 | HR 91 | Temp 97.9°F | Resp 20 | Ht 69.0 in | Wt 181.0 lb

## 2021-02-15 DIAGNOSIS — R87615 Unsatisfactory cytologic smear of cervix: Secondary | ICD-10-CM | POA: Insufficient documentation

## 2021-02-15 NOTE — Progress Notes (Signed)
° °  Subjective:    Patient ID: Mary Park, female    DOB: 09-21-1965, 56 y.o.   MRN: [id number]  Chief Complaint: PAP  Patient was seen in office for papa smear on 02/04/21. Her pap said there were not enough cells to read pap properly. So patient is coming in for repeat      Review of Systems  Constitutional:  Negative for diaphoresis.  Eyes:  Negative for pain.  Respiratory:  Negative for shortness of breath.   Cardiovascular:  Negative for chest pain, palpitations and leg swelling.  Gastrointestinal:  Negative for abdominal pain.  Endocrine: Negative for polydipsia.  Skin:  Negative for rash.  Neurological:  Negative for dizziness, weakness and headaches.  Hematological:  Does not bruise/bleed easily.  All other systems reviewed and are negative.     Objective:   Physical Exam Genitourinary:    General: Normal vulva.     Vagina: No vaginal discharge.     Rectum: Normal.     Comments: Cervix parous and pink No adnexal masses or tenderness         Assessment & Plan:   Angelys Flum in today with chief complaint of No chief complaint on file.   1. Encounter for repeat Pap smear due to previous insuff cervical cells Lab results pending    The above assessment and management plan was discussed with the patient. The patient verbalized understanding of and has agreed to the management plan. Patient is aware to call the clinic if symptoms persist or worsen. Patient is aware when to return to the clinic for a follow-up visit. Patient educated on when it is appropriate to go to the emergency department.   Mary-Margaret Hassell Done, FNP

## 2021-02-22 LAB — CYTOLOGY - PAP
Comment: NEGATIVE
Diagnosis: UNDETERMINED — AB
High risk HPV: NEGATIVE

## 2021-03-16 ENCOUNTER — Ambulatory Visit (INDEPENDENT_AMBULATORY_CARE_PROVIDER_SITE_OTHER): Payer: BC Managed Care – PPO | Admitting: Orthopedic Surgery

## 2021-03-16 ENCOUNTER — Encounter: Payer: Self-pay | Admitting: Orthopedic Surgery

## 2021-03-16 ENCOUNTER — Other Ambulatory Visit: Payer: Self-pay

## 2021-03-16 ENCOUNTER — Ambulatory Visit: Payer: BC Managed Care – PPO

## 2021-03-16 VITALS — BP 132/88 | HR 90 | Ht 69.0 in | Wt 178.0 lb

## 2021-03-16 DIAGNOSIS — M25512 Pain in left shoulder: Secondary | ICD-10-CM | POA: Diagnosis not present

## 2021-03-16 NOTE — Patient Instructions (Signed)
Consider ibuprofen daily for a period of 10 days.  Then take the medication as needed.   Rotator Cuff Tear/Tendinitis Rehab   Ask your health care provider which exercises are safe for you. Do exercises exactly as told by your health care provider and adjust them as directed. It is normal to feel mild stretching, pulling, tightness, or discomfort as you do these exercises. Stop right away if you feel sudden pain or your pain gets worse. Do not begin these exercises until told by your health care provider. Stretching and range-of-motion exercises  These exercises warm up your muscles and joints and improve the movement and flexibility of your shoulder. These exercises also help to relieve pain.  Shoulder pendulum In this exercise, you let the injured arm dangle toward the floor and then swing it like a clock pendulum. Stand near a table or counter that you can hold onto for balance. Bend forward at the waist and let your left / right arm hang straight down. Use your other arm to support you and help you stay balanced. Relax your left / right arm and shoulder muscles, and move your hips and your trunk so your left / right arm swings freely. Your arm should swing because of the motion of your body, not because you are using your arm or shoulder muscles. Keep moving your hips and trunk so your arm swings in the following directions, as told by your health care provider: Side to side. Forward and backward. In clockwise and counterclockwise circles. Slowly return to the starting position. Repeat 10 times, or for 10 seconds per direction. Complete this exercise 2-3 times a day.      Shoulder flexion, seated This exercise is sometimes called table slides. In this exercise, you raise your arm in front of your body until you feel a stretch in your injured shoulder. Sit in a stable chair so your left / right forearm can rest on a flat surface. Your elbow should rest at a height that keeps your upper arm  next to your body. Keeping your left / right shoulder relaxed, lean forward at the waist and let your hand slide forward (flexion). Stop when you feel a stretch in your shoulder, or when you reach the angle that is recommended by your health care provider. Hold for 5 seconds. Slowly return to the starting position. Repeat 10 times. Complete this exercise 1-2  times a day.       Shoulder flexion, standing In this exercise, you raise your arm in front of your body (flexion) until you feel a stretch in your injured shoulder. Stand and hold a broomstick, a cane, or a similar object. Place your hands a little more than shoulder-width apart on the object. Your left / right hand should be palm-up, and your other hand should be palm-down. Keep your elbow straight and your shoulder muscles relaxed. Push the stick up with your healthy arm to raise your left / right arm in front of your body, and then over your head until you feel a stretch in your shoulder. Avoid shrugging your shoulder while you raise your arm. Keep your shoulder blade tucked down toward the middle of your back. Keep your left / right shoulder muscles relaxed. Hold for 10 seconds. Slowly return to the starting position. Repeat 10 times. Complete this exercise 1-2 times a day.      Shoulder abduction, active-assisted You will need a stick, broom handle, or similar object to help you (assist) in doing this  exercise. Lie on your back. This is the supine position. Hold a broomstick, a cane, or a similar object. Place your hands a little more than shoulder-width apart on the object. Your left / right hand should be palm-up, and your other hand should be palm-down. Keeping your shoulder relaxed, push the stick to raise your left / right arm out to your side (abduction) and then over your head. Use your other hand to help move the stick. Stop when you feel a stretch in your shoulder, or when you reach the angle that is recommended by your  health care provider. Avoid shrugging your shoulder while you raise your arm. Keep your shoulder blade tucked down toward the middle of your back. Hold for 10 seconds. Slowly return to the starting position. Repeat 10 times. Complete this exercise 1-2 times a day.      Shoulder flexion, active-assisted Lie on your back. You may bend your knees for comfort. Hold a broomstick, a cane, or a similar object so that your hands are about shoulder-width apart. Your palms should face toward your feet. Raise your left / right arm over your head, then behind your head toward the floor (flexion). Use your other hand to help you do this (active-assisted). Stop when you feel a gentle stretch in your shoulder, or when you reach the angle that is recommended by your health care provider. Hold for 10 seconds. Use the stick and your other arm to help you return your left / right arm to the starting position. Repeat 10 times. Complete this exercise 1-2 times a day.      External rotation Sit in a stable chair without armrests, or stand up. Tuck a soft object, such as a folded towel or a small ball, under your left / right upper arm. Hold a broomstick, a cane, or a similar object with your palms face-down, toward the floor. Bend your elbows to a 90-degree angle (right angle), and keep your hands about shoulder-width apart. Straighten your healthy arm and push the stick across your body, toward your left / right side. Keep your left / right arm bent. This will rotate your left / right forearm away from your body (external rotation). Hold for 10 seconds. Slowly return to the starting position. Repeat 10 times. Complete this exercise 1-2 times a day.        Strengthening exercises These exercises build strength and endurance in your shoulder. Endurance is the ability to use your muscles for a long time, even after they get tired. Do not start doing these exercises until your health care provider  approves. Shoulder flexion, isometric Stand or sit in a doorway, facing the door frame. Keep your left / right arm straight and make a gentle fist with your hand. Place your fist against the door frame. Only your fist should be touching the frame. Keep your upper arm at your side. Gently press your fist against the door frame, as if you are trying to raise your arm above your head (isometric shoulder flexion). Avoid shrugging your shoulder while you press your hand into the door frame. Keep your shoulder blade tucked down toward the middle of your back. Hold for 10 seconds. Slowly release the tension, and relax your muscles completely before you repeat the exercise. Repeat 10 times. Complete this exercise 3 times per week.      Shoulder abduction, isometric Stand or sit in a doorway. Your left / right arm should be closest to the door frame. Keep  your left / right arm straight, and place the back of your hand against the door frame. Only your hand should be touching the frame. Keep the rest of your arm close to your side. Gently press the back of your hand against the door frame, as if you are trying to raise your arm out to the side (isometric shoulder abduction). Avoid shrugging your shoulder while you press your hand into the door frame. Keep your shoulder blade tucked down toward the middle of your back. Hold for 10 seconds. Slowly release the tension, and relax your muscles completely before you repeat the exercise. Repeat 10 times. Complete this exercise 3 times per week.      Internal rotation, isometric This is an exercise in which you press your palm against a door frame without moving your shoulder joint (isometric). Stand or sit in a doorway, facing the door frame. Bend your left / right elbow, and place the palm of your hand against the door frame. Only your palm should be touching the frame. Keep your upper arm at your side. Gently press your hand against the door frame, as if  you are trying to push your arm toward your abdomen (internal rotation). Gradually increase the pressure until you are pressing as hard as you can. Stop increasing the pressure if you feel shoulder pain. Avoid shrugging your shoulder while you press your hand into the door frame. Keep your shoulder blade tucked down toward the middle of your back. Hold for 10 seconds. Slowly release the tension, and relax your muscles completely before you repeat the exercise. Repeat 10 times. Complete this exercise 3 times per week.      External rotation, isometric This is an exercise in which you press the back of your wrist against a door frame without moving your shoulder joint (isometric). Stand or sit in a doorway, facing the door frame. Bend your left / right elbow and place the back of your wrist against the door frame. Only the back of your wrist should be touching the frame. Keep your upper arm at your side. Gently press your wrist against the door frame, as if you are trying to push your arm away from your abdomen (external rotation). Gradually increase the pressure until you are pressing as hard as you can. Stop increasing the pressure if you feel pain. Avoid shrugging your shoulder while you press your wrist into the door frame. Keep your shoulder blade tucked down toward the middle of your back. Hold for 10 seconds. Slowly release the tension, and relax your muscles completely before you repeat the exercise. Repeat 10 times. Complete this exercise 3 times per week.       Scapular retraction Sit in a stable chair without armrests, or stand up. Secure an exercise band to a stable object in front of you so the band is at shoulder height. Hold one end of the exercise band in each hand. Your palms should face down. Squeeze your shoulder blades together (retraction) and move your elbows slightly behind you. Do not shrug your shoulders upward while you do this. Hold for 10 seconds. Slowly return to  the starting position. Repeat 10 times. Complete this exercise 3 times per week.      Shoulder extension Sit in a stable chair without armrests, or stand up. Secure an exercise band to a stable object in front of you so the band is above shoulder height. Hold one end of the exercise band in each hand. Straighten your  elbows and lift your hands up to shoulder height. Squeeze your shoulder blades together and pull your hands down to the sides of your thighs (extension). Stop when your hands are straight down by your sides. Do not let your hands go behind your body. Hold for 10 seconds. Slowly return to the starting position. Repeat 10 times. Complete this exercise 3 times per week.       Scapular protraction, supine Lie on your back on a firm surface (supine position). Hold a 5 lbs (or soup can) weight in your left / right hand. Raise your left / right arm straight into the air so your hand is directly above your shoulder joint. Push the weight into the air so your shoulder (scapula) lifts off the surface that you are lying on. The scapula will push up or forward (protraction). Do not move your head, neck, or back. Hold for 10 seconds. Slowly return to the starting position. Let your muscles relax completely before you repeat this exercise.  Repeat 10 times. Complete this exercise 3 times per week.

## 2021-03-17 ENCOUNTER — Encounter: Payer: Self-pay | Admitting: Orthopedic Surgery

## 2021-03-17 NOTE — Progress Notes (Signed)
New Patient Visit  Assessment: Mary Park is a 56 y.o. female with the following: 1. Acute pain of left shoulder  Plan: Acute onset of left shoulder pain, atraumatic.  This has been ongoing for a month.  Radiographs are negative for an acute injury.  Her range of motion is pretty good.  The pain in her shoulder is diffuse.  She is taking ibuprofen intermittently, but has also tried a prednisone Dosepak.  After discussing the x-rays, as well as her physical exam, I am not concerned about a rotator cuff tendon tear.  Nonetheless, the shoulder is irritated.  I recommended more consistent use of ibuprofen over the next 7-10 days.  Have also provided her with a home exercise program.  If she continues to struggle, we can proceed with an injection.  We can also discuss formal physical therapy.  All questions were answered, in the presence of a Spanish interpreter.  Follow-up as needed.  Follow-up: Return if symptoms worsen or fail to improve.  Subjective:  Chief Complaint  Patient presents with   Shoulder Pain    Lt shoulder for 1 mo getting worse. Has tried steroids and ibuprofen with no relief.     History of Present Illness: Mary Park is a 57 y.o. female who presents for evaluation of left shoulder pain.  She states she had pain in the left shoulder for approximately 1 month.  The pain is progressively worsening.  He has difficulty with some motions.  Pain does get worse at night, and radiates distally.  She tried some prednisone, with limited improvement in her pain.  She has been taking ibuprofen on a limited basis.  She has not worked with physical therapy.  No prior injections.  No specific injury.   Review of Systems: No fevers or chills No numbness or tingling No chest pain No shortness of breath No bowel or bladder dysfunction No GI distress No headaches   Medical History:  History reviewed. No pertinent past medical history.  Past Surgical History:  Procedure  Laterality Date   BX OF LEFT BREAST     CESAREAN SECTION     One deceased and two living    Family History  Problem Relation Age of Onset   Hypertension Mother    Arthritis Father    Social History   Tobacco Use   Smoking status: Never   Smokeless tobacco: Never  Substance Use Topics   Alcohol use: No   Drug use: No    No Known Allergies  No outpatient medications have been marked as taking for the 03/16/21 encounter (Office Visit) with Oliver Barre, MD.    Objective: BP 132/88    Pulse 90    Ht 5\' 9"  (1.753 m)    Wt 178 lb (80.7 kg)    BMI 26.29 kg/m   Physical Exam:  General: Alert and oriented. and No acute distress. Gait: Normal gait.  Left shoulder without deformity.  No atrophy is appreciated.  Forward flexion to 150 degrees with mild discomfort.  Internal rotation to her lumbar spine.  External rotation or side to 50 degrees.  Tenderness to palpation over the anterior and lateral left shoulder.  Mild tenderness into the trapezius muscle.  Strength is 5/5.  Negative belly press.  2+ radial pulse.  Fingers are warm and well-perfused.  IMAGING: I personally ordered and reviewed the following images  X-rays of the left shoulder were obtained in clinic today.  No acute injuries are noted.  No evidence of  proximal humeral migration.  Well-maintained glenohumeral joint space.  Shoulder is reduced.  Impression: Negative left shoulder x-ray  New Medications:  No orders of the defined types were placed in this encounter.     Oliver Barre, MD  03/17/2021 9:33 AM

## 2021-04-26 DIAGNOSIS — M26621 Arthralgia of right temporomandibular joint: Secondary | ICD-10-CM | POA: Diagnosis not present

## 2021-05-02 ENCOUNTER — Ambulatory Visit: Payer: BC Managed Care – PPO | Admitting: Orthopedic Surgery

## 2022-03-07 ENCOUNTER — Encounter: Payer: Self-pay | Admitting: Nurse Practitioner

## 2022-03-07 ENCOUNTER — Other Ambulatory Visit (HOSPITAL_COMMUNITY)
Admission: RE | Admit: 2022-03-07 | Discharge: 2022-03-07 | Disposition: A | Discharge status: Home / Self Care | Payer: BC Managed Care – PPO | Source: Ambulatory Visit | Attending: Nurse Practitioner | Admitting: Nurse Practitioner

## 2022-03-07 ENCOUNTER — Ambulatory Visit (INDEPENDENT_AMBULATORY_CARE_PROVIDER_SITE_OTHER): Payer: BC Managed Care – PPO | Admitting: Nurse Practitioner

## 2022-03-07 VITALS — BP 122/74 | HR 70 | Temp 98.3°F | Resp 20 | Ht 69.0 in | Wt 173.0 lb

## 2022-03-07 DIAGNOSIS — F339 Major depressive disorder, recurrent, unspecified: Secondary | ICD-10-CM

## 2022-03-07 DIAGNOSIS — Z0000 Encounter for general adult medical examination without abnormal findings: Secondary | ICD-10-CM | POA: Diagnosis not present

## 2022-03-07 DIAGNOSIS — Z0001 Encounter for general adult medical examination with abnormal findings: Secondary | ICD-10-CM | POA: Diagnosis not present

## 2022-03-07 DIAGNOSIS — R7989 Other specified abnormal findings of blood chemistry: Secondary | ICD-10-CM | POA: Diagnosis not present

## 2022-03-07 MED ORDER — SERTRALINE HCL 50 MG PO TABS: 50.0000 mg | ORAL_TABLET | Freq: Every day | ORAL | 1 refills | Status: DC

## 2022-03-07 NOTE — Addendum Note (Signed)
Addended by: Chevis Pretty on: 03/07/2022 03:26 PM   Modules accepted: Orders

## 2022-03-07 NOTE — Patient Instructions (Signed)
Exercising to Stay Healthy To become healthy and stay healthy, it is recommended that you do moderate-intensity and vigorous-intensity exercise. You can tell that you are exercising at a moderate intensity if your heart starts beating faster and you start breathing faster but can still hold a conversation. You can tell that you are exercising at a vigorous intensity if you are breathing much harder and faster and cannot hold a conversation while exercising. How can exercise benefit me? Exercising regularly is important. It has many health benefits, such as: Improving overall fitness, flexibility, and endurance. Increasing bone density. Helping with weight control. Decreasing body fat. Increasing muscle strength and endurance. Reducing stress and tension, anxiety, depression, or anger. Improving overall health. What guidelines should I follow while exercising? Before you start a new exercise program, talk with your health care provider. Do not exercise so much that you hurt yourself, feel dizzy, or get very short of breath. Wear comfortable clothes and wear shoes with good support. Drink plenty of water while you exercise to prevent dehydration or heat stroke. Work out until your breathing and your heartbeat get faster (moderate intensity). How often should I exercise? Choose an activity that you enjoy, and set realistic goals. Your health care provider can help you make an activity plan that is individually designed and works best for you. Exercise regularly as told by your health care provider. This may include: Doing strength training two times a week, such as: Lifting weights. Using resistance bands. Push-ups. Sit-ups. Yoga. Doing a certain intensity of exercise for a given amount of time. Choose from these options: A total of 150 minutes of moderate-intensity exercise every week. A total of 75 minutes of vigorous-intensity exercise every week. A mix of moderate-intensity and  vigorous-intensity exercise every week. Children, pregnant women, people who have not exercised regularly, people who are overweight, and older adults may need to talk with a health care provider about what activities are safe to perform. If you have a medical condition, be sure to talk with your health care provider before you start a new exercise program. What are some exercise ideas? Moderate-intensity exercise ideas include: Walking 1 mile (1.6 km) in about 15 minutes. Biking. Hiking. Golfing. Dancing. Water aerobics. Vigorous-intensity exercise ideas include: Walking 4.5 miles (7.2 km) or more in about 1 hour. Jogging or running 5 miles (8 km) in about 1 hour. Biking 10 miles (16.1 km) or more in about 1 hour. Lap swimming. Roller-skating or in-line skating. Cross-country skiing. Vigorous competitive sports, such as football, basketball, and soccer. Jumping rope. Aerobic dancing. What are some everyday activities that can help me get exercise? Yard work, such as: Pushing a lawn mower. Raking and bagging leaves. Washing your car. Pushing a stroller. Shoveling snow. Gardening. Washing windows or floors. How can I be more active in my day-to-day activities? Use stairs instead of an elevator. Take a walk during your lunch break. If you drive, park your car farther away from your work or school. If you take public transportation, get off one stop early and walk the rest of the way. Stand up or walk around during all of your indoor phone calls. Get up, stretch, and walk around every 30 minutes throughout the day. Enjoy exercise with a friend. Support to continue exercising will help you keep a regular routine of activity. Where to find more information You can find more information about exercising to stay healthy from: U.S. Department of Health and Human Services: www.hhs.gov Centers for Disease Control and Prevention (  CDC): www.cdc.gov Summary Exercising regularly is  important. It will improve your overall fitness, flexibility, and endurance. Regular exercise will also improve your overall health. It can help you control your weight, reduce stress, and improve your bone density. Do not exercise so much that you hurt yourself, feel dizzy, or get very short of breath. Before you start a new exercise program, talk with your health care provider. This information is not intended to replace advice given to you by your health care provider. Make sure you discuss any questions you have with your health care provider. Document Revised: 05/07/2020 Document Reviewed: 05/07/2020 Elsevier Patient Education  2023 Elsevier Inc.  

## 2022-03-07 NOTE — Progress Notes (Addendum)
Subjective:    Patient ID: Mary Park, female    DOB: 02/28/1965, 57 y.o.   MRN: [id number]   Chief Complaint: Annual Exam    HPI:  Mary Park is a 57 y.o. who identifies as a female who was assigned female at birth.   Social history: Lives with: husband and kids   Comes in today for follow up of the following chronic medical issues:  1. Annual physical exam Pap last year was not enough cells to evaluate but patient refused to come back in and repeat. Here today to repeat.  2. Depression, recurrent (Mary Park) She was in [place] for several weeks and went to the doctor there and they started her on zoloft. She says that is really helping.    03/07/2022    3:01 PM 02/04/2021    9:35 AM 03/04/2019    3:23 PM  Depression screen PHQ 2/9  Decreased Interest 0 2 0  Down, Depressed, Hopeless 1 2 0  PHQ - 2 Score 1 4 0  Altered sleeping 1 1   Tired, decreased energy 1 1   Change in appetite 1 1   Feeling bad or failure about yourself  1 1   Trouble concentrating 0 1   Moving slowly or fidgety/restless 0 0   Suicidal thoughts 0 0   PHQ-9 Score 5 9   Difficult doing work/chores Not difficult at all Not difficult at all       New complaints: She was also told had thyroid nodule and they did [place] but was negative.  No Known Allergies Outpatient Encounter Medications as of 03/07/2022  Medication Sig   sertraline (ZOLOFT) 50 MG tablet Take 50 mg by mouth daily.   No facility-administered encounter medications on file as of 03/07/2022.    Past Surgical History:  Procedure Laterality Date   BX OF LEFT BREAST     CESAREAN SECTION     One deceased and two living    Family History  Problem Relation Age of Onset   Hypertension Mother    Arthritis Father       Controlled substance contract: n/a     Review of Systems  Constitutional:  Negative for diaphoresis.  Eyes:  Negative for pain.  Respiratory:  Negative for shortness of breath.   Cardiovascular:  Negative  for chest pain, palpitations and leg swelling.  Gastrointestinal:  Negative for abdominal pain.  Endocrine: Negative for polydipsia.  Skin:  Negative for rash.  Neurological:  Negative for dizziness, weakness and headaches.  Hematological:  Does not bruise/bleed easily.  All other systems reviewed and are negative.      Objective:   Physical Exam Vitals and nursing note reviewed.  Constitutional:      General: She is not in acute distress.    Appearance: Normal appearance. She is well-developed.  HENT:     Head: Normocephalic.     Right Ear: Tympanic membrane normal.     Left Ear: Tympanic membrane normal.     Nose: Nose normal.     Mouth/Throat:     Mouth: Mucous membranes are moist.  Eyes:     Pupils: Pupils are equal, round, and reactive to light.  Neck:     Vascular: No carotid bruit or JVD.  Cardiovascular:     Rate and Rhythm: Normal rate and regular rhythm.     Heart sounds: Normal heart sounds.  Pulmonary:     Effort: Pulmonary effort is normal. No respiratory distress.  Breath sounds: Normal breath sounds. No wheezing or rales.  Chest:     Chest wall: No tenderness.  Breasts:    Right: Normal.     Left: Normal.  Abdominal:     General: Bowel sounds are normal. There is no distension or abdominal bruit.     Palpations: Abdomen is soft. There is no hepatomegaly, splenomegaly, mass or pulsatile mass.     Tenderness: There is no abdominal tenderness.  Genitourinary:    General: Normal vulva.     Vagina: No vaginal discharge.     Rectum: Normal.     Comments: Cervix nonparous and pink No adnexal masses or tenderness Musculoskeletal:        General: Normal range of motion.     Cervical back: Normal range of motion and neck supple.  Lymphadenopathy:     Cervical: No cervical adenopathy.  Skin:    General: Skin is warm and dry.  Neurological:     Mental Status: She is alert and oriented to person, place, and time.     Deep Tendon Reflexes: Reflexes are  normal and symmetric.  Psychiatric:        Behavior: Behavior normal.        Thought Content: Thought content normal.        Judgment: Judgment normal.    BP 122/74   Pulse 70   Temp 98.3 F (36.8 C) (Temporal)   Resp 20   Ht 5' 9"$  (1.753 m)   Wt 173 lb (78.5 kg)   SpO2 100%   BMI 25.55 kg/m         Assessment & Plan:   Mary Park comes in today with chief complaint of Annual Exam   Diagnosis and orders addressed:  1. Annual physical exam Labs pending  2. Depression, recurrent [facility]) Stress management   Labs pending Health Maintenance reviewed Diet and exercise encouraged  Follow up plan: 6 months   Mary Hassell Done, FNP

## 2022-03-08 LAB — CBC WITH DIFFERENTIAL/PLATELET
Basophils Absolute: 0 10*3/uL (ref 0.0–0.2)
Basos: 0 %
EOS (ABSOLUTE): 0.1 10*3/uL (ref 0.0–0.4)
Eos: 1 %
Hematocrit: 40.8 % (ref 34.0–46.6)
Hemoglobin: 13.8 g/dL (ref 11.1–15.9)
Immature Grans (Abs): 0 10*3/uL (ref 0.0–0.1)
Immature Granulocytes: 0 %
Lymphocytes Absolute: 1.6 10*3/uL (ref 0.7–3.1)
Lymphs: 30 %
MCH: 31.2 pg (ref 26.6–33.0)
MCHC: 33.8 g/dL (ref 31.5–35.7)
MCV: 92 fL (ref 79–97)
Monocytes Absolute: 0.4 10*3/uL (ref 0.1–0.9)
Monocytes: 7 %
Neutrophils Absolute: 3.2 10*3/uL (ref 1.4–7.0)
Neutrophils: 62 %
Platelets: 258 10*3/uL (ref 150–450)
RBC: 4.42 x10E6/uL (ref 3.77–5.28)
RDW: 12.5 % (ref 11.7–15.4)
WBC: 5.2 10*3/uL (ref 3.4–10.8)

## 2022-03-08 LAB — CMP14+EGFR
ALT: 27 IU/L (ref 0–32)
AST: 21 IU/L (ref 0–40)
Albumin/Globulin Ratio: 1.6 (ref 1.2–2.2)
Albumin: 4.5 g/dL (ref 3.8–4.9)
Alkaline Phosphatase: 135 IU/L — ABNORMAL HIGH (ref 44–121)
BUN/Creatinine Ratio: 17 (ref 9–23)
BUN: 10 mg/dL (ref 6–24)
Bilirubin Total: 0.3 mg/dL (ref 0.0–1.2)
CO2: 22 mmol/L (ref 20–29)
Calcium: 11.4 mg/dL — ABNORMAL HIGH (ref 8.7–10.2)
Chloride: 103 mmol/L (ref 96–106)
Creatinine, Ser: 0.59 mg/dL (ref 0.57–1.00)
Globulin, Total: 2.8 g/dL (ref 1.5–4.5)
Glucose: 75 mg/dL (ref 70–99)
Potassium: 4 mmol/L (ref 3.5–5.2)
Sodium: 139 mmol/L (ref 134–144)
Total Protein: 7.3 g/dL (ref 6.0–8.5)
eGFR: 106 mL/min/{1.73_m2} (ref >59)

## 2022-03-08 LAB — LIPID PANEL
Chol/HDL Ratio: 2.6 ratio (ref 0.0–4.4)
Cholesterol, Total: 204 mg/dL — ABNORMAL HIGH (ref 100–199)
HDL: 78 mg/dL (ref >39)
LDL Chol Calc (NIH): 107 mg/dL — ABNORMAL HIGH (ref 0–99)
Triglycerides: 106 mg/dL (ref 0–149)
VLDL Cholesterol Cal: 19 mg/dL (ref 5–40)

## 2022-03-08 LAB — VITAMIN D 25 HYDROXY (VIT D DEFICIENCY, FRACTURES): Vit D, 25-Hydroxy: 19.5 ng/mL — ABNORMAL LOW (ref 30.0–100.0)

## 2022-03-08 LAB — THYROID PANEL WITH TSH
Free Thyroxine Index: 1.9 (ref 1.2–4.9)
T3 Uptake Ratio: 27 % (ref 24–39)
T4, Total: 7.1 ug/dL (ref 4.5–12.0)
TSH: 2.53 u[IU]/mL (ref 0.450–4.500)

## 2022-03-08 MED ORDER — VITAMIN D (ERGOCALCIFEROL) 1.25 MG (50000 UNIT) PO CAPS: 50000.0000 [IU] | ORAL_CAPSULE | ORAL | 3 refills | Status: DC

## 2022-03-08 NOTE — Addendum Note (Signed)
Addended by: Chevis Pretty on: 03/08/2022 01:32 PM   Modules accepted: Orders

## 2022-03-13 LAB — CYTOLOGY - PAP
Diagnosis: NEGATIVE
Diagnosis: REACTIVE

## 2022-03-17 ENCOUNTER — Ambulatory Visit (INDEPENDENT_AMBULATORY_CARE_PROVIDER_SITE_OTHER): Payer: BC Managed Care – PPO | Admitting: *Deleted

## 2022-03-17 DIAGNOSIS — Z23 Encounter for immunization: Secondary | ICD-10-CM | POA: Diagnosis not present

## 2022-03-17 NOTE — Progress Notes (Signed)
Tdap given and patient tolerated well.

## 2022-03-19 DIAGNOSIS — S61412A Laceration without foreign body of left hand, initial encounter: Secondary | ICD-10-CM | POA: Diagnosis not present

## 2022-03-23 ENCOUNTER — Encounter: Payer: Self-pay | Admitting: Radiology

## 2022-04-25 ENCOUNTER — Other Ambulatory Visit (HOSPITAL_COMMUNITY): Payer: Self-pay | Admitting: Nurse Practitioner

## 2022-04-25 DIAGNOSIS — Z1231 Encounter for screening mammogram for malignant neoplasm of breast: Secondary | ICD-10-CM

## 2022-04-27 ENCOUNTER — Ambulatory Visit (HOSPITAL_COMMUNITY)
Admission: RE | Admit: 2022-04-27 | Discharge: 2022-04-27 | Disposition: A | Discharge status: Home / Self Care | Payer: BC Managed Care – PPO | Source: Ambulatory Visit | Attending: Nurse Practitioner | Admitting: Nurse Practitioner

## 2022-04-27 ENCOUNTER — Encounter (HOSPITAL_COMMUNITY): Payer: Self-pay

## 2022-04-27 ENCOUNTER — Inpatient Hospital Stay (HOSPITAL_COMMUNITY): Admission: RE | Admit: 2022-04-27 | Payer: BC Managed Care – PPO | Source: Ambulatory Visit

## 2022-04-27 DIAGNOSIS — Z1231 Encounter for screening mammogram for malignant neoplasm of breast: Secondary | ICD-10-CM | POA: Diagnosis not present

## 2022-07-18 ENCOUNTER — Other Ambulatory Visit: Payer: Self-pay | Admitting: Nurse Practitioner

## 2022-07-18 DIAGNOSIS — R7989 Other specified abnormal findings of blood chemistry: Secondary | ICD-10-CM

## 2022-08-01 DIAGNOSIS — R070 Pain in throat: Secondary | ICD-10-CM | POA: Diagnosis not present

## 2022-08-01 DIAGNOSIS — Z20822 Contact with and (suspected) exposure to covid-19: Secondary | ICD-10-CM | POA: Diagnosis not present

## 2022-08-01 DIAGNOSIS — J069 Acute upper respiratory infection, unspecified: Secondary | ICD-10-CM | POA: Diagnosis not present

## 2022-08-27 ENCOUNTER — Other Ambulatory Visit: Payer: Self-pay | Admitting: Nurse Practitioner

## 2022-08-29 ENCOUNTER — Encounter: Payer: Self-pay | Admitting: Nurse Practitioner

## 2022-08-29 ENCOUNTER — Ambulatory Visit (INDEPENDENT_AMBULATORY_CARE_PROVIDER_SITE_OTHER): Payer: BC Managed Care – PPO | Admitting: Nurse Practitioner

## 2022-08-29 VITALS — BP 117/68 | HR 79 | Temp 98.3°F | Ht 69.0 in | Wt 187.8 lb

## 2022-08-29 DIAGNOSIS — J3489 Other specified disorders of nose and nasal sinuses: Secondary | ICD-10-CM | POA: Insufficient documentation

## 2022-08-29 DIAGNOSIS — R0981 Nasal congestion: Secondary | ICD-10-CM | POA: Diagnosis not present

## 2022-08-29 DIAGNOSIS — K219 Gastro-esophageal reflux disease without esophagitis: Secondary | ICD-10-CM | POA: Diagnosis not present

## 2022-08-29 MED ORDER — FLUTICASONE PROPIONATE 50 MCG/ACT NA SUSP
2.0000 | Freq: Every day | NASAL | 6 refills | Status: AC
Start: 2022-08-29 — End: ?

## 2022-08-29 MED ORDER — OMEPRAZOLE 20 MG PO CPDR
20.0000 mg | DELAYED_RELEASE_CAPSULE | Freq: Every day | ORAL | 0 refills | Status: AC
Start: 2022-08-29 — End: ?

## 2022-08-29 MED ORDER — MONTELUKAST SODIUM 10 MG PO TABS: 10.0000 mg | ORAL_TABLET | Freq: Every day | ORAL | 0 refills | Status: DC

## 2022-08-29 NOTE — Progress Notes (Signed)
Established Patient Office Visit  Subjective   Patient ID: Mary Park, female    DOB: December 07, 1965  Age: 57 y.o. MRN: [id number]  Chief Complaint  Patient presents with   Cough    Has had cough for about a month. Went to urgent care had negative covid and strep. Was given cough medicine and did not help   Nasal Congestion    Cough Associated symptoms include heartburn. Pertinent negatives include no chills, fever, headaches, rash or shortness of breath.   Cough: Patient complains of nonproductive cough.  Symptoms began 1 month ago.  The cough is non-productive, without wheezing, dyspnea or hemoptysis and is aggravated by  laying down most at night  Associated symptoms include:heartburn " a few night ago, I had some chocolate before and I had heart burn and cough all night" Patient does not have new pets. Patient does not have a history of asthma. Patient does not have a history of environmental allergens. Patient does not have recent travel. Patient does not have a history of smoking. Patient  does not have previous Chest X-ray. Patient does not have had a PPD done.  Patient Active Problem List   Diagnosis Date Noted   Nasal congestion with rhinorrhea 08/29/2022   Gastroesophageal reflux disease without esophagitis 08/29/2022   Depression, recurrent (HCC) 03/07/2022   History reviewed. No pertinent past medical history. Past Surgical History:  Procedure Laterality Date   BREAST BIOPSY Left 2010   benign   CESAREAN SECTION     One deceased and two living   Social History   Tobacco Use   Smoking status: Never   Smokeless tobacco: Never  Substance Use Topics   Alcohol use: No   Drug use: No   Social History   Socioeconomic History   Marital status: Married    Spouse name: Not on file   Number of children: Not on file   Years of education: Not on file   Highest education level: Not on file  Occupational History   Not on file  Tobacco Use   Smoking status: Never    Smokeless tobacco: Never  Substance and Sexual Activity   Alcohol use: No   Drug use: No   Sexual activity: Not on file  Other Topics Concern   Not on file  Social History Narrative   Not on file   Social Determinants of Health   Financial Resource Strain: Not on file  Food Insecurity: Not on file  Transportation Needs: Not on file  Physical Activity: Not on file  Stress: Not on file  Social Connections: Not on file  Intimate Partner Violence: Not on file   Family Status  Relation Name Status   Mother  Alive   Father  Alive   Brother  Alive   Daughter  Alive   Son  Alive   Brother  Alive   Brother  Alive   Brother  Alive   Brother  Alive   Brother  Alive  No partnership data on file   Family History  Problem Relation Age of Onset   Hypertension Mother    Arthritis Father    No Known Allergies    Review of Systems  Constitutional:  Negative for chills and fever.  Respiratory:  Positive for cough. Negative for shortness of breath.   Gastrointestinal:  Positive for heartburn. Negative for blood in stool, melena, nausea and vomiting.  Skin:  Negative for itching and rash.  Neurological:  Negative for dizziness and headaches.  Endo/Heme/Allergies:  Negative for polydipsia. Does not bruise/bleed easily.   Negative unless indicated in HPI   Objective:     BP 117/68   Pulse 79   Temp 98.3 F (36.8 C) (Temporal)   Ht 5\' 9"  (1.753 m)   Wt 187 lb 12.8 oz (85.2 kg)   SpO2 96%   BMI 27.73 kg/m  BP Readings from Last 3 Encounters:  08/29/22 117/68  03/07/22 122/74  03/16/21 132/88   Wt Readings from Last 3 Encounters:  08/29/22 187 lb 12.8 oz (85.2 kg)  03/07/22 173 lb (78.5 kg)  03/16/21 178 lb (80.7 kg)      Physical Exam Vitals and nursing note reviewed.  Constitutional:      Appearance: Normal appearance.  HENT:     Head: Normocephalic and atraumatic.     Nose: Congestion present.  Eyes:     General: No scleral icterus.    Extraocular  Movements: Extraocular movements intact.     Conjunctiva/sclera: Conjunctivae normal.     Pupils: Pupils are equal, round, and reactive to light.  Cardiovascular:     Rate and Rhythm: Normal rate and regular rhythm.  Pulmonary:     Effort: Pulmonary effort is normal.     Breath sounds: Normal breath sounds.     Comments: Dry non productive cough Abdominal:     General: Bowel sounds are normal.     Palpations: Abdomen is soft.     Tenderness: There is no guarding or rebound.  Musculoskeletal:        General: Normal range of motion.     Right lower leg: No edema.     Left lower leg: No edema.  Skin:    General: Skin is warm and dry.  Neurological:     Mental Status: She is alert and oriented to person, place, and time. Mental status is at baseline.  Psychiatric:        Mood and Affect: Mood normal.        Behavior: Behavior normal.        Judgment: Judgment normal.     No results found for any visits on 08/29/22.  Last CBC Lab Results  Component Value Date   WBC 5.2 03/07/2022   HGB 13.8 03/07/2022   HCT 40.8 03/07/2022   MCV 92 03/07/2022   MCH 31.2 03/07/2022   RDW 12.5 03/07/2022   PLT 258 03/07/2022   Last metabolic panel Lab Results  Component Value Date   GLUCOSE 75 03/07/2022   NA 139 03/07/2022   K 4.0 03/07/2022   CL 103 03/07/2022   CO2 22 03/07/2022   BUN 10 03/07/2022   CREATININE 0.59 03/07/2022   EGFR 106 03/07/2022   CALCIUM 11.4 (H) 03/07/2022   PROT 7.3 03/07/2022   ALBUMIN 4.5 03/07/2022   LABGLOB 2.8 03/07/2022   AGRATIO 1.6 03/07/2022   BILITOT 0.3 03/07/2022   ALKPHOS 135 (H) 03/07/2022   AST 21 03/07/2022   ALT 27 03/07/2022   Last lipids Lab Results  Component Value Date   CHOL 204 (H) 03/07/2022   HDL 78 03/07/2022   LDLCALC 107 (H) 03/07/2022   TRIG 106 03/07/2022   CHOLHDL 2.6 03/07/2022   Last hemoglobin A1c No results found for: "HGBA1C"      Assessment & Plan:  Gastroesophageal reflux disease without  esophagitis -     Omeprazole; Take 1 capsule (20 mg total) by mouth daily.  Dispense: 90 capsule; Refill: 0  Nasal congestion with rhinorrhea -  Montelukast Sodium; Take 1 tablet (10 mg total) by mouth at bedtime.  Dispense: 90 tablet; Refill: 0 -     Fluticasone Propionate; Place 2 sprays into both nostrils daily.  Dispense: 16 g; Refill: 6   Natesha was seen for GERD exacerbation and cough, no acute distress Omeprazole 20 mg daily Singular 10 mg 1-tab at bedtime Flonase  Avoid eating-2 hrs hours before bed Avoid caffeine, chocolate, citrus food Plan of care discuss with client, all questions answered  Return if symptoms worsen or fail to improve.    Arrie Aran Santa Lighter, DNP [facility] [address] [place], [place] [address] [phone]

## 2022-09-28 ENCOUNTER — Other Ambulatory Visit: Payer: Self-pay | Admitting: Nurse Practitioner

## 2022-09-28 DIAGNOSIS — R7989 Other specified abnormal findings of blood chemistry: Secondary | ICD-10-CM

## 2023-02-15 ENCOUNTER — Ambulatory Visit: Payer: BC Managed Care – PPO | Admitting: Nurse Practitioner

## 2023-02-15 ENCOUNTER — Ambulatory Visit: Payer: Self-pay | Admitting: Nurse Practitioner

## 2023-02-15 ENCOUNTER — Encounter: Payer: Self-pay | Admitting: Nurse Practitioner

## 2023-02-15 ENCOUNTER — Ambulatory Visit (INDEPENDENT_AMBULATORY_CARE_PROVIDER_SITE_OTHER): Payer: BC Managed Care – PPO

## 2023-02-15 VITALS — BP 123/72 | HR 88 | Temp 97.6°F | Ht 69.0 in | Wt 182.0 lb

## 2023-02-15 DIAGNOSIS — N200 Calculus of kidney: Secondary | ICD-10-CM | POA: Diagnosis not present

## 2023-02-15 DIAGNOSIS — R300 Dysuria: Secondary | ICD-10-CM | POA: Diagnosis not present

## 2023-02-15 DIAGNOSIS — R109 Unspecified abdominal pain: Secondary | ICD-10-CM

## 2023-02-15 DIAGNOSIS — R319 Hematuria, unspecified: Secondary | ICD-10-CM | POA: Diagnosis not present

## 2023-02-15 LAB — URINALYSIS, COMPLETE
Bilirubin, UA: NEGATIVE
Glucose, UA: NEGATIVE
Leukocytes,UA: NEGATIVE
Nitrite, UA: NEGATIVE
Specific Gravity, UA: 1.02 (ref 1.005–1.030)
Urobilinogen, Ur: 0.2 mg/dL (ref 0.2–1.0)
pH, UA: 6.5 (ref 5.0–7.5)

## 2023-02-15 LAB — MICROSCOPIC EXAMINATION
Bacteria, UA: NONE SEEN
Epithelial Cells (non renal): NONE SEEN /[HPF] (ref 0–10)
RBC, Urine: >30 /[HPF] — AB (ref 0–2)
Renal Epithel, UA: NONE SEEN /[HPF]
WBC, UA: NONE SEEN /[HPF] (ref 0–5)

## 2023-02-15 MED ORDER — HYDROCODONE-ACETAMINOPHEN 5-325 MG PO TABS: 1.0000 | ORAL_TABLET | Freq: Four times a day (QID) | ORAL | 0 refills | Status: DC | PRN

## 2023-02-15 MED ORDER — TAMSULOSIN HCL 0.4 MG PO CAPS
0.4000 mg | ORAL_CAPSULE | Freq: Every day | ORAL | 3 refills | Status: AC
Start: 2023-02-15 — End: ?

## 2023-02-15 MED ORDER — KETOROLAC TROMETHAMINE 60 MG/2ML IM SOLN
60.0000 mg | Freq: Once | INTRAMUSCULAR | Status: AC
Start: 2023-02-15 — End: 2023-02-20

## 2023-02-15 NOTE — Patient Instructions (Signed)
 Kidney Stones Kidney stones are rock-like masses that form inside of the kidneys. Kidneys are organs that make pee (urine). A kidney stone may move into other parts of the urinary tract, including: The tubes that connect the kidneys to the bladder (ureters). The bladder. The tube that carries urine out of the body (urethra). Kidney stones can cause very bad pain and can block the flow of pee. The stone usually leaves your body through your pee. A doctor may need to take out the stone. What are the causes? Kidney stones may be caused by: Too much calcium in the body. This may be caused by too much parathyroid hormone in the blood. Uric acid crystals in the bladder. The body makes uric acid when you eat certain foods. Narrowing of one or both of the ureters. A kidney blockage that you were born with. Past surgery on the kidney or the ureters. What increases the risk? You are more likely to develop this condition if: You have had a kidney stone in the past. Other people in your family have had kidney stones. You do not drink enough water. You eat a diet that is high in protein, salt (sodium), or sugar. You are very overweight (obese). What are the signs or symptoms? Symptoms of a kidney stone may include: Pain in the side of the belly, right below the ribs. Pain usually spreads to the groin. Needing to pee often or right away. Pain when peeing. Blood in your pee. Feeling like you may vomit (nauseous). Vomiting. Fever and chills. How is this treated? Treatment depends on the size, location, and makeup of the kidney stones. The stones will often pass out of the body when you pee. You may need to: Drink more fluid to help pass the stone. In some cases, you may be given fluids through an IV tube at the hospital. Take medicine for pain. Change your diet to help keep kidney stones from coming back. Sometimes, you may need: A procedure to break up kidney stones using a beam of light (laser)  or shock waves. Surgery to remove the kidney stones. Follow these instructions at home: Medicines Take over-the-counter and prescription medicines only as told by your doctor. Ask your doctor if the medicine prescribed to you requires you to avoid driving or using machinery. Eating and drinking Drink enough fluid to keep your pee pale yellow. You may be told to drink at least 8-10 glasses of water each day. This will help you pass the stone. If told by your doctor, change your diet. You may be told to: Limit how much salt you eat. Eat more fruits and vegetables. Limit how much meat, poultry, fish, and eggs you eat. Follow instructions from your doctor about what you may eat and drink. General instructions Collect pee samples as told by your doctor. You may need to collect a pee sample: 24 hours after a stone comes out. 8-12 weeks after a stone comes out, and every 6-12 months after that. Strain your pee every time you pee. Use the strainer that your doctor recommends. Do not throw out the stone. Keep it so that it can be tested by your doctor. Keep all follow-up visits. You may need X-rays and ultrasounds to make sure the stone has come out. How is this prevented? To prevent another kidney stone: Drink enough fluid to keep your pee pale yellow. This is the best way to prevent kidney stones. Eat healthy foods. Avoid certain foods as told by your doctor. You  may be told to eat less protein. Stay at a healthy weight. Where to find more information National Kidney Foundation (NKF): kidney.org [facility]): urologyhealth.org Contact a doctor if: You have pain that gets worse or does not get better with medicine. Get help right away if: You have a fever or chills. You get very bad pain. You get new pain in your belly. You faint. You cannot pee. This information is not intended to replace advice given to you by your health care provider. Make sure you discuss any  questions you have with your health care provider. Document Revised: 09/02/2021 Document Reviewed: 09/02/2021 Elsevier Patient Education  2024 [employer].

## 2023-02-15 NOTE — Progress Notes (Signed)
   Subjective:    Patient ID: Mary Park, female    DOB: 07-24-1965, 58 y.o.   MRN: [id number]   Chief Complaint: hematuria and pelvic pain  HPI  Patient developed abdominal pain at work. Saw blood in urine. Pain is left flank. She went home and layed down. Had some nausea. Rates pain 10/10. Cannot seat still. Racking back and forth helps aleve pain slightly. Denies urgency or frequency.  Patient Active Problem List   Diagnosis Date Noted   Nasal congestion with rhinorrhea 08/29/2022   Gastroesophageal reflux disease without esophagitis 08/29/2022   Depression, recurrent (HCC) 03/07/2022       Review of Systems  Genitourinary:  Positive for flank pain. Negative for dysuria, frequency and urgency.       Objective:   Physical Exam Constitutional:      Appearance: Normal appearance.     Comments: Patient unable to seat still in exam room. Rocking back and forth and holding left lower abdomen  Cardiovascular:     Rate and Rhythm: Normal rate and regular rhythm.     Heart sounds: Normal heart sounds.  Pulmonary:     Breath sounds: Normal breath sounds.  Abdominal:     General: Abdomen is flat.     Palpations: Abdomen is soft.     Tenderness: There is no abdominal tenderness. There is no right CVA tenderness, left CVA tenderness or guarding.  Skin:    General: Skin is warm.  Neurological:     General: No focal deficit present.     Mental Status: She is alert.    BP 123/72   Pulse 88   Temp 97.6 F (36.4 C) (Temporal)   Ht 5\' 9"  (1.753 m)   Wt 182 lb (82.6 kg)   BMI 26.88 kg/m   Urine 3+ blood- other wise clear.  KUB- ureteral calculi left lower part of ureter-Preliminary reading by Paulene Floor, FNP  [facility]        Assessment & Plan:   Mary Park in today with chief complaint of Dysuria   1. Dysuria (Primary) - Urinalysis, Complete - Urine Culture  2. Flank pain - DG Abd 1 View  3. Kidney stone on left side Force fluids Meds as  prescribed RTO prn  Meds ordered this encounter  Medications   ketorolac (TORADOL) injection 60 mg   tamsulosin (FLOMAX) 0.4 MG CAPS capsule    Sig: Take 1 capsule (0.4 mg total) by mouth daily.    Dispense:  30 capsule    Refill:  3    Supervising Provider:   Arville Care A [[id number]]   HYDROcodone-acetaminophen (NORCO/VICODIN) 5-325 MG tablet    Sig: Take 1 tablet by mouth every 6 (six) hours as needed for moderate pain (pain score 4-6).    Dispense:  30 tablet    Refill:  0    Supervising Provider:   Arville Care A [[id number]]      The above assessment and management plan was discussed with the patient. The patient verbalized understanding of and has agreed to the management plan. Patient is aware to call the clinic if symptoms persist or worsen. Patient is aware when to return to the clinic for a follow-up visit. Patient educated on when it is appropriate to go to the emergency department.   Mary-Margaret Daphine Deutscher, FNP

## 2023-02-15 NOTE — Telephone Encounter (Signed)
Reason for Disposition  Side (flank) or lower back pain present    Side and pelvic pain  Answer Assessment - Initial Assessment Questions 1. SYMPTOM: "What's the main symptom you're concerned about?" (e.g., frequency, incontinence)     Urinary urgency, pelvic pain   2. ONSET: "When did the  *No Answer*  start?"     Today  3. PAIN: "Is there any pain?" If Yes, ask: "How bad is it?" (Scale: 1-10; mild, moderate, severe)     8/10, pain comes and goes  4. OTHER SYMPTOMS: "Do you have any other symptoms?" (e.g., blood in urine, fever, flank pain, pain with urination)     Blood in urine, pelvic pain an side  Protocols used: Urinary Symptoms-A-AH   Patient reported having blood in urine, urgency, and pelvic pain that started today. When she tries to urinate, she is only voiding small amounts. Appointment scheduled for this afternoon.

## 2023-02-17 LAB — URINE CULTURE

## 2023-05-04 ENCOUNTER — Other Ambulatory Visit (HOSPITAL_COMMUNITY): Payer: Self-pay | Admitting: Nurse Practitioner

## 2023-05-04 DIAGNOSIS — Z1231 Encounter for screening mammogram for malignant neoplasm of breast: Secondary | ICD-10-CM

## 2023-05-11 ENCOUNTER — Ambulatory Visit (HOSPITAL_COMMUNITY)
Admission: RE | Admit: 2023-05-11 | Discharge: 2023-05-11 | Disposition: A | Discharge status: Home / Self Care | Source: Ambulatory Visit | Attending: Nurse Practitioner | Admitting: Nurse Practitioner

## 2023-05-11 DIAGNOSIS — Z1231 Encounter for screening mammogram for malignant neoplasm of breast: Secondary | ICD-10-CM | POA: Insufficient documentation

## 2023-11-08 ENCOUNTER — Encounter: Admitting: Family Medicine

## 2024-01-14 ENCOUNTER — Ambulatory Visit (INDEPENDENT_AMBULATORY_CARE_PROVIDER_SITE_OTHER): Payer: Self-pay | Admitting: Nurse Practitioner

## 2024-01-14 ENCOUNTER — Encounter: Payer: Self-pay | Admitting: Nurse Practitioner

## 2024-01-14 VITALS — BP 111/72 | HR 77 | Temp 98.1°F | Ht 69.0 in | Wt 180.0 lb

## 2024-01-14 DIAGNOSIS — Z0000 Encounter for general adult medical examination without abnormal findings: Secondary | ICD-10-CM | POA: Diagnosis not present

## 2024-01-14 DIAGNOSIS — F339 Major depressive disorder, recurrent, unspecified: Secondary | ICD-10-CM | POA: Diagnosis not present

## 2024-01-14 DIAGNOSIS — K219 Gastro-esophageal reflux disease without esophagitis: Secondary | ICD-10-CM | POA: Diagnosis not present

## 2024-01-14 NOTE — Progress Notes (Signed)
 "  Subjective:    Patient ID: Mary Park, female    DOB: 1965-10-29, 58 y.o.   MRN: [id number]   Chief Complaint: Annual Exam (No pap)    HPI:  Mary Park is a 58 y.o. who identifies as a female who was assigned female at birth.   Social history: Lives with: husband and kids   Comes in today for follow up of the following chronic medical issues:  1. Annual physical exam Pap last year was not enough cells to evaluate but patient refused to come back in and repeat. Here today to repeat.  2. Depression, recurrent (HCC) She was on zoloft  but stopped taking it. Says she did not feel like she needed it.     01/14/2024    8:23 AM 02/15/2023    3:18 PM 08/29/2022    2:52 PM  Depression screen PHQ 2/9  Decreased Interest 0 1 1  Down, Depressed, Hopeless 0 1 1  PHQ - 2 Score 0 2 2  Altered sleeping 0 0 0  Tired, decreased energy 0 1 0  Change in appetite 0 0 0  Feeling bad or failure about yourself  0 0 0  Trouble concentrating 0 0 0  Moving slowly or fidgety/restless 0 0 0  Suicidal thoughts 0 0 0  PHQ-9 Score 0 3  2   Difficult doing work/chores Not difficult at all Not difficult at all Not difficult at all     Data saved with a previous flowsheet row definition      New complaints: None today  No Known Allergies Outpatient Encounter Medications as of 01/14/2024  Medication Sig   omeprazole  (PRILOSEC) 20 MG capsule Take 1 capsule (20 mg total) by mouth daily.   [DISCONTINUED] Vitamin D , Ergocalciferol , (DRISDOL ) 1.25 MG (50000 UNIT) CAPS capsule Take 1 capsule by mouth once a week   [DISCONTINUED] fluticasone  (FLONASE ) 50 MCG/ACT nasal spray Place 2 sprays into both nostrils daily. (Patient not taking: Reported on 01/14/2024)   [DISCONTINUED] HYDROcodone -acetaminophen  (NORCO/VICODIN) 5-325 MG tablet Take 1 tablet by mouth every 6 (six) hours as needed for moderate pain (pain score 4-6).   [DISCONTINUED] montelukast  (SINGULAIR ) 10 MG tablet Take 1 tablet (10 mg  total) by mouth at bedtime. (Patient not taking: Reported on 01/14/2024)   [DISCONTINUED] sertraline  (ZOLOFT ) 50 MG tablet Take 1 tablet (50 mg total) by mouth daily. **NEEDS TO BE SEEN BEFORE NEXT REFILL**   [DISCONTINUED] tamsulosin  (FLOMAX ) 0.4 MG CAPS capsule Take 1 capsule (0.4 mg total) by mouth daily.   No facility-administered encounter medications on file as of 01/14/2024.    Past Surgical History:  Procedure Laterality Date   BREAST BIOPSY Left 2010   benign   CESAREAN SECTION     One deceased and two living    Family History  Problem Relation Age of Onset   Hypertension Mother    Arthritis Father       Controlled substance contract: n/a     Review of Systems  Constitutional:  Negative for diaphoresis.  Eyes:  Negative for pain.  Respiratory:  Negative for shortness of breath.   Cardiovascular:  Negative for chest pain, palpitations and leg swelling.  Gastrointestinal:  Negative for abdominal pain.  Endocrine: Negative for polydipsia.  Skin:  Negative for rash.  Neurological:  Negative for dizziness, weakness and headaches.  Hematological:  Does not bruise/bleed easily.  All other systems reviewed and are negative.      Objective:   Physical Exam Vitals and nursing note  reviewed.  Constitutional:      General: She is not in acute distress.    Appearance: Normal appearance. She is well-developed.  HENT:     Head: Normocephalic.     Right Ear: Tympanic membrane normal.     Left Ear: Tympanic membrane normal.     Nose: Nose normal.     Mouth/Throat:     Mouth: Mucous membranes are moist.  Eyes:     Pupils: Pupils are equal, round, and reactive to light.  Neck:     Vascular: No carotid bruit or JVD.  Cardiovascular:     Rate and Rhythm: Normal rate and regular rhythm.     Heart sounds: Normal heart sounds.  Pulmonary:     Effort: Pulmonary effort is normal. No respiratory distress.     Breath sounds: Normal breath sounds. No wheezing or rales.   Chest:     Chest wall: No tenderness.  Breasts:    Right: Normal.     Left: Normal.  Abdominal:     General: Bowel sounds are normal. There is no distension or abdominal bruit.     Palpations: Abdomen is soft. There is no hepatomegaly, splenomegaly, mass or pulsatile mass.     Tenderness: There is no abdominal tenderness.  Genitourinary:    Comments: Cervix nonparous and pink No adnexal masses or tenderness Musculoskeletal:        General: Normal range of motion.     Cervical back: Normal range of motion and neck supple.  Lymphadenopathy:     Cervical: No cervical adenopathy.  Skin:    General: Skin is warm and dry.  Neurological:     Mental Status: She is alert and oriented to person, place, and time.     Deep Tendon Reflexes: Reflexes are normal and symmetric.  Psychiatric:        Behavior: Behavior normal.        Thought Content: Thought content normal.        Judgment: Judgment normal.    BP 111/72   Pulse 77   Temp 98.1 F (36.7 C) (Temporal)   Ht 5' 9 (1.753 m)   Wt 180 lb (81.6 kg)   SpO2 100%   BMI 26.58 kg/m         Assessment & Plan:   Mary Park comes in today with chief complaint of Annual Exam (No pap)   Diagnosis and orders addressed:  1. Annual physical exam Labs pending  2. Depression, recurrent [facility]) Stress management   Labs pending Health Maintenance reviewed Diet and exercise encouraged  Follow up plan: 6 months   Mary Gladis, FNP  "

## 2024-01-14 NOTE — Patient Instructions (Signed)
 Exercising to Stay Healthy To become healthy and stay healthy, it is recommended that you do moderate-intensity and vigorous-intensity exercise. You can tell that you are exercising at a moderate intensity if your heart starts beating faster and you start breathing faster but can still hold a conversation. You can tell that you are exercising at a vigorous intensity if you are breathing much harder and faster and cannot hold a conversation while exercising. How can exercise benefit me? Exercising regularly is important. It has many health benefits, such as: Improving overall fitness, flexibility, and endurance. Increasing bone density. Helping with weight control. Decreasing body fat. Increasing muscle strength and endurance. Reducing stress and tension, anxiety, depression, or anger. Improving overall health. What guidelines should I follow while exercising? Before you start a new exercise program, talk with your health care provider. Do not exercise so much that you hurt yourself, feel dizzy, or get very short of breath. Wear comfortable clothes and wear shoes with good support. Drink plenty of water while you exercise to prevent dehydration or heat stroke. Work out until your breathing and your heartbeat get faster (moderate intensity). How often should I exercise? Choose an activity that you enjoy, and set realistic goals. Your health care provider can help you make an activity plan that is individually designed and works best for you. Exercise regularly as told by your health care provider. This may include: Doing strength training two times a week, such as: Lifting weights. Using resistance bands. Push-ups. Sit-ups. Yoga. Doing a certain intensity of exercise for a given amount of time. Choose from these options: A total of 150 minutes of moderate-intensity exercise every week. A total of 75 minutes of vigorous-intensity exercise every week. A mix of moderate-intensity and  vigorous-intensity exercise every week. Children, pregnant women, people who have not exercised regularly, people who are overweight, and older adults may need to talk with a health care provider about what activities are safe to perform. If you have a medical condition, be sure to talk with your health care provider before you start a new exercise program. What are some exercise ideas? Moderate-intensity exercise ideas include: Walking 1 mile (1.6 km) in about 15 minutes. Biking. Hiking. Golfing. Dancing. Water aerobics. Vigorous-intensity exercise ideas include: Walking 4.5 miles (7.2 km) or more in about 1 hour. Jogging or running 5 miles (8 km) in about 1 hour. Biking 10 miles (16.1 km) or more in about 1 hour. Lap swimming. Roller-skating or in-line skating. Cross-country skiing. Vigorous competitive sports, such as football, basketball, and soccer. Jumping rope. Aerobic dancing. What are some everyday activities that can help me get exercise? Yard work, such as: Child psychotherapist. Raking and bagging leaves. Washing your car. Pushing a stroller. Shoveling snow. Gardening. Washing windows or floors. How can I be more active in my day-to-day activities? Use stairs instead of an elevator. Take a walk during your lunch break. If you drive, park your car farther away from your work or school. If you take public transportation, get off one stop early and walk the rest of the way. Stand up or walk around during all of your indoor phone calls. Get up, stretch, and walk around every 30 minutes throughout the day. Enjoy exercise with a friend. Support to continue exercising will help you keep a regular routine of activity. Where to find more information You can find more information about exercising to stay healthy from: [place] Department of Health and Human Services: ThisPath.fi Centers for Disease Control and Prevention (  CDC): [employer].br Summary Exercising regularly is  important. It will improve your overall fitness, flexibility, and endurance. Regular exercise will also improve your overall health. It can help you control your weight, reduce stress, and improve your bone density. Do not exercise so much that you hurt yourself, feel dizzy, or get very short of breath. Before you start a new exercise program, talk with your health care provider. This information is not intended to replace advice given to you by your health care provider. Make sure you discuss any questions you have with your health care provider. Document Revised: 05/07/2020 Document Reviewed: 05/07/2020 Elsevier Patient Education  2024 [employer].

## 2024-01-15 ENCOUNTER — Ambulatory Visit: Payer: Self-pay | Admitting: Nurse Practitioner

## 2024-01-15 LAB — CBC WITH DIFFERENTIAL/PLATELET
Basophils Absolute: 0 x10E3/uL (ref 0.0–0.2)
Basos: 0 %
EOS (ABSOLUTE): 0.1 x10E3/uL (ref 0.0–0.4)
Eos: 1 %
Hematocrit: 42.7 % (ref 34.0–46.6)
Hemoglobin: 14.1 g/dL (ref 11.1–15.9)
Immature Grans (Abs): 0 x10E3/uL (ref 0.0–0.1)
Immature Granulocytes: 0 %
Lymphocytes Absolute: 0.9 x10E3/uL (ref 0.7–3.1)
Lymphs: 24 %
MCH: 30.7 pg (ref 26.6–33.0)
MCHC: 33 g/dL (ref 31.5–35.7)
MCV: 93 fL (ref 79–97)
Monocytes Absolute: 0.4 x10E3/uL (ref 0.1–0.9)
Monocytes: 9 %
Neutrophils Absolute: 2.4 x10E3/uL (ref 1.4–7.0)
Neutrophils: 66 %
Platelets: 269 x10E3/uL (ref 150–450)
RBC: 4.6 x10E6/uL (ref 3.77–5.28)
RDW: 12.2 % (ref 11.7–15.4)
WBC: 3.7 x10E3/uL (ref 3.4–10.8)

## 2024-01-15 LAB — LIPID PANEL
Chol/HDL Ratio: 3 ratio (ref 0.0–4.4)
Cholesterol, Total: 194 mg/dL (ref 100–199)
HDL: 65 mg/dL
LDL Chol Calc (NIH): 97 mg/dL (ref 0–99)
Triglycerides: 186 mg/dL — ABNORMAL HIGH (ref 0–149)
VLDL Cholesterol Cal: 32 mg/dL (ref 5–40)

## 2024-01-15 LAB — CMP14+EGFR
ALT: 14 IU/L (ref 0–32)
AST: 15 IU/L (ref 0–40)
Albumin: 4.4 g/dL (ref 3.8–4.9)
Alkaline Phosphatase: 155 IU/L — ABNORMAL HIGH (ref 49–135)
BUN/Creatinine Ratio: 24 — ABNORMAL HIGH (ref 9–23)
BUN: 13 mg/dL (ref 6–24)
Bilirubin Total: 0.4 mg/dL (ref 0.0–1.2)
CO2: 21 mmol/L (ref 20–29)
Calcium: 10.9 mg/dL — ABNORMAL HIGH (ref 8.7–10.2)
Chloride: 105 mmol/L (ref 96–106)
Creatinine, Ser: 0.55 mg/dL — ABNORMAL LOW (ref 0.57–1.00)
Globulin, Total: 2.9 g/dL (ref 1.5–4.5)
Glucose: 84 mg/dL (ref 70–99)
Potassium: 4.6 mmol/L (ref 3.5–5.2)
Sodium: 139 mmol/L (ref 134–144)
Total Protein: 7.3 g/dL (ref 6.0–8.5)
eGFR: 108 mL/min/1.73

## 2024-01-15 LAB — THYROID PANEL WITH TSH
Free Thyroxine Index: 2.3 (ref 1.2–4.9)
T3 Uptake Ratio: 29 % (ref 24–39)
T4, Total: 7.8 ug/dL (ref 4.5–12.0)
TSH: 2.73 u[IU]/mL (ref 0.450–4.500)

## 2024-01-15 LAB — VITAMIN D 25 HYDROXY (VIT D DEFICIENCY, FRACTURES): Vit D, 25-Hydroxy: 19.9 ng/mL — AB (ref 30.0–100.0)
